# Patient Record
Sex: Male | Born: 1960 | Race: White | Hispanic: No | Marital: Single | Smoking: Former smoker
Health system: Southern US, Community
[De-identification: ages and names within clinical notes are randomized; demographics above are authoritative.]

## PROBLEM LIST (undated history)

## (undated) DIAGNOSIS — E039 Hypothyroidism, unspecified: Secondary | ICD-10-CM

## (undated) DIAGNOSIS — A630 Anogenital (venereal) warts: Secondary | ICD-10-CM

---

## 1999-01-29 ENCOUNTER — Encounter: Payer: Self-pay | Admitting: Specialist

## 1999-01-29 ENCOUNTER — Ambulatory Visit (HOSPITAL_COMMUNITY): Admission: RE | Admit: 1999-01-29 | Discharge: 1999-01-29 | Payer: Self-pay | Admitting: Specialist

## 1999-02-12 ENCOUNTER — Ambulatory Visit (HOSPITAL_COMMUNITY): Admission: RE | Admit: 1999-02-12 | Discharge: 1999-02-12 | Payer: Self-pay | Admitting: Specialist

## 1999-02-12 ENCOUNTER — Encounter: Payer: Self-pay | Admitting: Specialist

## 1999-02-26 ENCOUNTER — Ambulatory Visit (HOSPITAL_COMMUNITY): Admission: RE | Admit: 1999-02-26 | Discharge: 1999-02-26 | Payer: Self-pay | Admitting: Specialist

## 1999-02-28 ENCOUNTER — Encounter: Payer: Self-pay | Admitting: Specialist

## 2010-10-20 ENCOUNTER — Encounter: Payer: Self-pay | Admitting: Gastroenterology

## 2010-10-25 ENCOUNTER — Encounter: Payer: Self-pay | Admitting: Gastroenterology

## 2010-12-02 ENCOUNTER — Ambulatory Visit: Payer: Self-pay | Admitting: Gastroenterology

## 2010-12-02 DIAGNOSIS — E039 Hypothyroidism, unspecified: Secondary | ICD-10-CM | POA: Insufficient documentation

## 2010-12-02 DIAGNOSIS — R945 Abnormal results of liver function studies: Secondary | ICD-10-CM | POA: Insufficient documentation

## 2010-12-02 LAB — CONVERTED CEMR LAB
Free T4: 0.84 ng/dL (ref 0.60–1.60)
TSH: 1.9 microintl units/mL (ref 0.35–5.50)

## 2010-12-03 ENCOUNTER — Ambulatory Visit (HOSPITAL_COMMUNITY)
Admission: RE | Admit: 2010-12-03 | Discharge: 2010-12-03 | Payer: Self-pay | Source: Home / Self Care | Attending: Gastroenterology | Admitting: Gastroenterology

## 2010-12-31 ENCOUNTER — Ambulatory Visit
Admission: RE | Admit: 2010-12-31 | Discharge: 2010-12-31 | Payer: Self-pay | Source: Home / Self Care | Attending: Endocrinology | Admitting: Endocrinology

## 2010-12-31 DIAGNOSIS — R635 Abnormal weight gain: Secondary | ICD-10-CM | POA: Insufficient documentation

## 2010-12-31 DIAGNOSIS — E785 Hyperlipidemia, unspecified: Secondary | ICD-10-CM | POA: Insufficient documentation

## 2010-12-31 DIAGNOSIS — R6882 Decreased libido: Secondary | ICD-10-CM | POA: Insufficient documentation

## 2010-12-31 DIAGNOSIS — R5381 Other malaise: Secondary | ICD-10-CM | POA: Insufficient documentation

## 2010-12-31 DIAGNOSIS — R5383 Other fatigue: Secondary | ICD-10-CM

## 2011-01-03 ENCOUNTER — Other Ambulatory Visit: Payer: Self-pay | Admitting: Endocrinology

## 2011-01-03 ENCOUNTER — Ambulatory Visit
Admission: RE | Admit: 2011-01-03 | Discharge: 2011-01-03 | Payer: Self-pay | Source: Home / Self Care | Attending: Endocrinology | Admitting: Endocrinology

## 2011-01-03 LAB — TSH: TSH: 0.5 u[IU]/mL (ref 0.35–5.50)

## 2011-01-03 LAB — CORTISOL: Cortisol, Plasma: 1 ug/dL

## 2011-01-03 LAB — TESTOSTERONE: Testosterone: 369.39 ng/dL (ref 350.00–890.00)

## 2011-01-12 ENCOUNTER — Ambulatory Visit: Admit: 2011-01-12 | Payer: Self-pay | Admitting: Endocrinology

## 2011-01-25 NOTE — Assessment & Plan Note (Signed)
Summary: ELEVATED LIVER FUNCTIONS/YF   History of Present Illness Visit Type: Initial Consult Primary GI MD: Melvia Heaps MD [facility] Primary [place]ullen [place]: Cheri Rous, MD Requesting Kivon Aprea: Cheri Rous, MD Chief Complaint: Elevated liver funtion tests with overall fatigue for the last year. Pt states recently he has had some anxiety and fustration with fatigue sx. Pt denies any other sx.  History of Present Illness:   Dennis Valencia is a pleasant 50 year old white male referred at the request of Dr. Egbert Garibaldi for evaluation of abnormal liver tests.  For the past year he has been complaining of loss of libidol, fatigue, 45 pound weight gain and lack of energy.  He has been on thyroid medication at various doses for hypothyroidism.  He has had a battery of tests including CBC, comprehensive metabolic profile;   they were remarkable only for a positive ANA and ALT of 64.  Hepatic serologies for hepatitis were negative.  There is no history liver disease, alcoholism or drug abuse.   GI Review of Systems    Reports acid reflux and  weight gain.      Denies abdominal pain, belching, bloating, chest pain, dysphagia with liquids, dysphagia with solids, heartburn, loss of appetite, nausea, vomiting, vomiting blood, and  weight loss.      Reports constipation.     Denies anal fissure, black tarry stools, change in bowel habit, diarrhea, diverticulosis, fecal incontinence, heme positive stool, hemorrhoids, irritable bowel syndrome, jaundice, light color stool, liver problems, rectal bleeding, and  rectal pain. Preventive Screening-Counseling & Management  Alcohol-Tobacco     Smoking Status: quit      Drug Use:  no.      Current Medications (verified): 1)  Omeprazole 20 Mg Cpdr (Omeprazole) .... One Tablet By Mouth Once Daily 2)  Levothyroxine Sodium 150 Mcg Tabs (Levothyroxine Sodium) .... One Tablet By Mouth Once Daily  Allergies (verified): No Known Drug Allergies  Past History:  Past Medical  History: Hypothyroidism Anxiety Disorder  Past Surgical History: Unremarkable  Family History: Family History of Breast Cancer:Maternal Grandmother Family History of Diabetes: Cousin  Social History: Single Patient is a former smoker.  Alcohol Use - yes Daily Caffeine Use Illicit Drug Use - no Smoking Status:  quit Drug Use:  no  Review of Systems       The patient complains of fatigue and muscle pains/cramps.  The patient denies allergy/sinus, anemia, anxiety-new, arthritis/joint pain, back pain, blood in urine, breast changes/lumps, change in vision, confusion, cough, coughing up blood, depression-new, fainting, fever, headaches-new, hearing problems, heart murmur, heart rhythm changes, itching, menstrual pain, night sweats, nosebleeds, pregnancy symptoms, shortness of breath, skin rash, sleeping problems, sore throat, swelling of feet/legs, swollen lymph glands, thirst - excessive , urination - excessive , urination changes/pain, urine leakage, vision changes, and voice change.         All other systems were reviewed and were negative   Vital Signs:  Patient profile:   50 year old male Height:      77 inches Weight:      295 pounds BMI:     35.11 Pulse rate:   88 / minute Pulse rhythm:   regular BP sitting:   138 / 72  (right arm) Cuff size:   regular  Vitals Entered By: Christie Nottingham CMA Duncan Dull) (December 02, 2010 9:43 AM)  Physical Exam  Additional Exam:  On physical exam he is a well-developed well nourished male  skin: anicteric HEENT: normocephalic; PEERLA; no nasal or pharyngeal abnormalities neck:  supple nodes: no cervical lymphadenopathy chest: clear to ausculatation and percussion heart: no murmurs, gallops, or rubs abd: soft, nontender; BS normoactive; no abdominal masses, tenderness, organomegaly rectal: deferred ext: no cynanosis, clubbing, edema skeletal: no deformities neuro: oriented x 3; no focal abnormalities    Impression &  Recommendations:  Problem # 1:  NONSPECIFIC ABNORMAL RESULTS LIVR FUNCTION STUDY (ICD-794.8) Liver function abnormalities are probably due to hepatic steatosis.  Prior liver disease is unlikely.  Recommendations #1 abdominal ultrasound  Problem # 2:  HYPOTHYROIDISM (ICD-244.9)  Clinically his symptoms are very suggestive of hypothyroidism.  This includes his muscle pains, weight gain, loss of energy and myalgias.  Thyroid function tests are not available.  Medications #1 referral to endocrinology (Dr. Everardo All)  Orders: TLB-TSH (Thyroid Stimulating Hormone) (84443-TSH) TLB-T4 (Thyrox), Free [id number])  Other Orders: Ultrasound Abdomen (UAS)  Patient Instructions: 1)  Copy sent to : Cheri Rous, MD 2)  Your Ultrasound is scheduled on 12/03/2010 at 8am 3)  Your appointment with Dr Everardo All is scheduled on 12/31/2010 at 8:30am, Please bring medications and co-pay to your office appointment and if you need to cancel please call 24 hours in advance to avoid a no show fee 4)  The medication list was reviewed and reconciled.  All changed / newly prescribed medications were explained.  A complete medication list was provided to the patient / caregiver.

## 2011-01-25 NOTE — Letter (Signed)
Summary: Results Letter  [facility]  [address] [place], [place] [address]   Phone: [phone]  Fax: [phone]        December 02, 2010 MRN: [id number]    [facility] [address] [place], [place]  [address]    Dear Mr. ECHAVARRIA,  It is my pleasure to have treated you recently as a new patient in my office. I appreciate your confidence and the opportunity to participate in your care.  Since I do have a busy inpatient endoscopy schedule and office schedule, my office hours vary weekly. I am, however, available for emergency calls everyday through my office. If I am not available for an urgent office appointment, another one of our gastroenterologist will be able to assist you.  My well-trained staff are prepared to help you at all times. For emergencies after office hours, a physician from our Gastroenterology section is always available through my 24 hour answering service  Once again I welcome you as a new patient and I look forward to a happy and healthy relationship             Sincerely,  Louis Meckel MD  This letter has been electronically signed by your physician.  Appended Document: Results Letter letter mailed

## 2011-01-25 NOTE — Letter (Signed)
Summary: New Patient letter  [facility]  [address] [place], [place] [address]   Phone: [phone]  Fax: [phone]       10/25/2010 MRN: [id number]  [facility] [address] [place], [place]  [address]  Dear Mr. MILLIKAN,  Welcome to the Gastroenterology Division at [facility].    You are scheduled to see Dr.  Arlyce Dice on 12-02-10 at 10am on the 3rd floor at [facility], [address]  We ask that you try to arrive at our office 15 minutes prior to your appointment time to allow for check-in.  We would like you to complete the enclosed self-administered evaluation form prior to your visit and bring it with you on the day of your appointment.  We will review it with you.  Also, please bring a complete list of all your medications or, if you prefer, bring the medication bottles and we will list them.  Please bring your insurance card so that we Loria make a copy of it.  If your insurance requires a referral to see a specialist, please bring your referral form from your primary care physician.  Co-payments are due at the time of your visit and [place] be paid by cash, check or credit card.     Your office visit will consist of a consult with your physician (includes a physical exam), any laboratory testing he/she [place] order, scheduling of any necessary diagnostic testing (e.g. x-ray, ultrasound, CT-scan), and scheduling of a procedure (e.g. Endoscopy, Colonoscopy) if required.  Please allow enough time on your schedule to allow for any/all of these possibilities.    If you cannot keep your appointment, please call [phone] to cancel or reschedule prior to your appointment date.  This allows [place] the opportunity to schedule an appointment for another patient in need of care.  If you do not cancel or reschedule by 5 p.m. the business day prior to your appointment date, you will be charged a $50.00 late cancellation/no-show fee.    Thank you for choosing [facility] for your medical needs.  We appreciate the opportunity to care for you.  Please visit [place] at our website  to learn more about our practice.                     Sincerely,                                                             The Gastroenterology Division

## 2011-01-27 NOTE — Assessment & Plan Note (Signed)
Summary: New Endo/hypothyroidsim/Kaplan/cd   Vital Signs:  Patient profile:   50 year old male Height:      77 inches (195.58 cm) Weight:      252.25 pounds (114.66 kg) BMI:     30.02 O2 Sat:      96 % on Room air Temp:     98.8 degrees F (37.11 degrees C) oral Pulse rate:   80 / minute Pulse rhythm:   regular BP sitting:   118 / 86  (left arm) Cuff size:   large  Vitals Entered By: Brenton Grills CMA Duncan Dull) (December 31, 2010 8:39 AM)  O2 Flow:  Room air CC: New Endo Consult/Hypothyroidism/Dr. Kaplan/aj Is Patient Diabetic? No Comments Pt is no longer taking Omeprazole   Referring Provider:  r kaplan Primary Provider:  Cheri Rous, MD  CC:  New Endo Consult/Hypothyroidism/Dr. Kaplan/aj.  History of Present Illness: pt states few years of moderate weight gain at the abdominal area (50 lbs), and aassoc fatigue.  he has seen several specialists over the past 6 months, in attempt to explain his sxs.  w/u has been neg, except for elev tsh.  he was started on synthroid in mid-2011, at a dosage which has been increased to 125 micrograms/day.  tsh has normalized, but there has been no improvement in his sxs.  he took prednisone 2 mos ago, for approx 1 week.    Current Medications (verified): 1)  Omeprazole 20 Mg Cpdr (Omeprazole) .... One Tablet By Mouth Once Daily 2)  Levothyroxine Sodium 125 Mcg Tabs (Levothyroxine Sodium) .Marland Kitchen.. 1 Tablet By Mouth Once Daily  Allergies (verified): No Known Drug Allergies  Past History:  Past Medical History: Anxiety Disorder DYSLIPIDEMIA (ICD-272.4) HYPOTHYROIDISM (ICD-244.9) NONSPECIFIC ABNORMAL RESULTS LIVR FUNCTION STUDY (ICD-794.8)  Family History: Reviewed history from 12/02/2010 and no changes required. Family History of Breast Cancer:Maternal Grandmother Family History of Diabetes: Cousin.  Social History: Reviewed history from 12/02/2010 and no changes required. Single. Patient is a former smoker.  Alcohol Use - yes Daily  Caffeine Use Illicit Drug Use - no works as Consulting civil engineer.  Review of Systems       denies depression, constipation, numbness, polyuria, blurry vision, easy bruising, rhinorrhea, and syncope.  he reports decreased libido, leg cramps, hair loss, emotional lability, dry skin, and difficulty with concentration.    Physical Exam  General:  normal appearance.   Head:  head: no deformity eyes: no periorbital swelling, no proptosis external nose and ears are normal mouth: no lesion seen Neck:  Supple without thyroid enlargement or tenderness. No cervical lymphadenopathy, neck masses or tracheal deviation.  Lungs:  Clear to auscultation bilaterally. Normal respiratory effort.  Heart:  Regular rate and rhythm without murmurs or gallops noted. Normal S1,S2.   Abdomen:  abdomen is soft, nontender.  no hepatosplenomegaly.   not distended.  no hernia  Genitalia:  Normal external male genitalia with no urethral discharge.  Msk:  muscle bulk and strength are grossly normal.  no obvious joint swelling.  gait is normal and steady  Pulses:  dorsalis pedis intact bilat.  no carotid bruit  Extremities:  no deformity.  no ulcer on the feet.  feet are of normal color and temp.  no edema  Neurologic:  cn 2-12 grossly intact.   readily moves all 4's.   sensation is intact to touch on the feet  Skin:  normal texture and temp.  no rash is seen.  not diaphoretic.  no abdominal or  flank striae.  Cervical Nodes:  No significant adenopathy.  Psych:  Alert and cooperative; normal mood and affect; normal attention span and concentration.   Additional Exam:  FastTSH                   1.90 uIU/mL                 0.35-5.50 Free T4                   0.84 ng/dL     Impression & Recommendations:  Problem # 1:  WEIGHT GAIN (ICD-783.1) uncertain etiology  Problem # 2:  HYPOTHYROIDISM (ICD-244.9) well-replaced  Problem # 3:  DECREASED LIBIDO (ICD-799.81) uncertain etiology  Medications  Added to Medication List This Visit: 1)  Levothyroxine Sodium 125 Mcg Tabs (Levothyroxine sodium) .Marland Kitchen.. 1 tablet by mouth once daily 2)  Dexamethasone 1 Mg Tabs (Dexamethasone) .Marland Kitchen.. 1 tab at 10 pm, the night before blood test  Other Orders: Consultation Level IV (84696)  Patient Instructions: 1)  you should do a "dexamethasone suppression test."  for this, you would take dexamethasone 1 mg at 10 pm, then come in for a "cortisol"  (783.1) blood test the next morning before 9 am.  you do not need to be fasting for this test. 2)  also recheck tsh 244.9 and testosterone 799.81 then.   Prescriptions: DEXAMETHASONE 1 MG TABS (DEXAMETHASONE) 1 tab at 10 pm, the night before blood test  #1 x 0   Entered and Authorized by:   Minus Breeding MD   Signed by:   Minus Breeding MD on 12/31/2010   Method used:   Electronically to        [id number] (retail)       [address]       [place], [place]  [address]       Ph: [phone] or [phone]       Fax: [phone]   RxID:   [id number]    Orders Added: 1)  Consultation Level IV [06301]

## 2012-08-23 ENCOUNTER — Other Ambulatory Visit: Payer: Self-pay | Admitting: Urology

## 2012-10-09 ENCOUNTER — Encounter (HOSPITAL_BASED_OUTPATIENT_CLINIC_OR_DEPARTMENT_OTHER): Payer: Self-pay | Admitting: *Deleted

## 2012-10-09 NOTE — Progress Notes (Signed)
NPO AFTER MN. ARRIVES AT 0830. NEEDS HG. WILL TAKE SYNTHROID AM OF SURG W/ SIP OF WATER.

## 2012-10-11 DIAGNOSIS — A630 Anogenital (venereal) warts: Secondary | ICD-10-CM | POA: Diagnosis present

## 2012-10-11 NOTE — H&P (Signed)
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   Interval history: He reports that over the past several months he has noted a change in a lesion on his penis. He was very concerned about cancer. He was seen and evaluated and his lesions were felt to be condylomatous lesions. An attempt at treating one of the lesions on the shaft of his penis was undertaken but it did not appear successful and he was referred for further evaluation and treatment. He reports the lesions have increased in size over time. He has not had any sprain or splitting of his urinary stream and no blood spotting in his underwear. He reports having lesions on the shaft of his penis as well as near the anus. The lesions would be considered of mild severity with no modifying factors or associated signs and symptoms.   Past Medical History Problems  1. History of  Hypothyroidism 244.9  Surgical History Problems  1. History of  No Surgical Problems  Current Meds 1. Betamethasone Dipropionate Aug 0.05 % External Cream; Apply a thin coating to the affected  area 2 times per day; Therapy: 04Sep2012 to (Last Rx:04Sep2012)  Requested for: 04Sep2012 2. Cialis 20 MG Oral Tablet; TAKE TABLET  PRN; Therapy: (Recorded:04Sep2012) to 3. Multi Vitamin/Minerals TABS; Therapy: (Recorded:04Sep2012) to 4. Synthroid 125 MCG Oral Tablet; Therapy: (Recorded:04Sep2012) to 5. Viagra 100 MG Oral Tablet; TAKE TABLET  PRN; Therapy: (Recorded:04Sep2012) to 6. Vitamin D TABS; Therapy: (Recorded:04Sep2012) to  Allergies Medication  1. No Known Drug Allergies  Family History Problems  1. Family history of  Death In The Family Father 2. Family history of  Death In The Family Mother  Social History Problems    Alcohol Use   Caffeine Use   Exercise Habits   Marital History - Single   Occupation:   Tobacco Use 305.1  Review of Systems Genitourinary, constitutional, skin, eye,  otolaryngeal, hematologic/lymphatic, cardiovascular, pulmonary, endocrine, musculoskeletal, gastrointestinal, neurological and psychiatric system(s) were reviewed and pertinent findings if present are noted.  Genitourinary: erectile dysfunction, penile lesion and scrotal lesion.    Vitals Vital Signs BMI Calculated: 28.35 BSA Calculated: 2.46 Height: 6 ft 6 in Weight: 245 lb  Blood Pressure: 112 / 73 Temperature: 99.2 F Heart Rate: 86  Physical Exam Constitutional: Well nourished and well developed . No acute distress.  ENT:. The ears and nose are normal in appearance.  Neck: The appearance of the neck is normal and no neck mass is present.  Pulmonary: No respiratory distress and normal respiratory rhythm and effort.  Cardiovascular: Heart rate and rhythm are normal . No peripheral edema.  Abdomen: The abdomen is soft and nontender. No masses are palpated. No CVA tenderness. No hernias are palpable. No hepatosplenomegaly noted.  Rectal: The perineum is normal on inspection.  Genitourinary: Examination of the penis demonstrates no discharge, no masses and a normal meatus. The penis is uncircumcised.  He has multiple condylomatous lesions involving the midshaft dorsally as well as laterally. He also has a lesion on the scrotum. The scrotum is with lesions. The right epididymis is palpably normal and non-tender. The left epididymis is palpably normal and non-tender. The right testis is non-tender and without masses. The left testis is non-tender and without masses.  I noted 2 or 3 condylomatous lesions near the anus as well. There were primarily located on the right side.  Lymphatics: The femoral and inguinal nodes are not enlarged or tender.  Skin: Normal  skin turgor, no visible rash and no visible skin lesions.  Neuro/Psych:. Mood and affect are appropriate.    Results/Data  Old records or history reviewed: Notes from Dr. Molinda Bailiff office as above.  The following medical tests were  reviewed: Urinalysis today is clear.    Assessment Assessed  1. 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 We discussed the treatment options. Their number and size would likely not respond to topical agents. We discussed cryoablation but again there number and size would be best managed with laser ablation. I told him none of them appeared to be worrisome for cancer. We therefore discussed CO2 laser fulguration/ablation of his condylomatous lesions both on the penis, scrotum and perianal region under anesthesia.   Plan    He'll be scheduled for CO2 of his condylomatous lesions as an outpatient under anesthesia.

## 2012-10-12 ENCOUNTER — Encounter (HOSPITAL_BASED_OUTPATIENT_CLINIC_OR_DEPARTMENT_OTHER)
Admission: RE | Disposition: A | Discharge status: Home / Self Care | Payer: Self-pay | Source: Ambulatory Visit | Attending: Urology

## 2012-10-12 ENCOUNTER — Ambulatory Visit (HOSPITAL_BASED_OUTPATIENT_CLINIC_OR_DEPARTMENT_OTHER): Payer: 59 | Admitting: Anesthesiology

## 2012-10-12 ENCOUNTER — Encounter (HOSPITAL_BASED_OUTPATIENT_CLINIC_OR_DEPARTMENT_OTHER): Payer: Self-pay | Admitting: Anesthesiology

## 2012-10-12 ENCOUNTER — Encounter (HOSPITAL_BASED_OUTPATIENT_CLINIC_OR_DEPARTMENT_OTHER): Payer: Self-pay | Admitting: *Deleted

## 2012-10-12 ENCOUNTER — Ambulatory Visit (HOSPITAL_BASED_OUTPATIENT_CLINIC_OR_DEPARTMENT_OTHER)
Admission: RE | Admit: 2012-10-12 | Discharge: 2012-10-12 | Disposition: A | Discharge status: Home / Self Care | Payer: 59 | Source: Ambulatory Visit | Attending: Urology | Admitting: Urology

## 2012-10-12 DIAGNOSIS — Z79899 Other long term (current) drug therapy: Secondary | ICD-10-CM | POA: Insufficient documentation

## 2012-10-12 DIAGNOSIS — E039 Hypothyroidism, unspecified: Secondary | ICD-10-CM | POA: Insufficient documentation

## 2012-10-12 DIAGNOSIS — A630 Anogenital (venereal) warts: Secondary | ICD-10-CM | POA: Insufficient documentation

## 2012-10-12 HISTORY — DX: Hypothyroidism, unspecified: E03.9

## 2012-10-12 HISTORY — PX: CONDYLOMA EXCISION/FULGURATION: SHX1389

## 2012-10-12 HISTORY — DX: Anogenital (venereal) warts: A63.0

## 2012-10-12 LAB — POCT HEMOGLOBIN-HEMACUE: Hemoglobin: 15.1 g/dL (ref 13.0–17.0)

## 2012-10-12 SURGERY — REMOVAL, CONDYLOMA
Anesthesia: General | Site: Penis

## 2012-10-12 MED ORDER — LIDOCAINE HCL (CARDIAC) 20 MG/ML IV SOLN
INTRAVENOUS | Status: DC | PRN
  Administered 2012-10-12: 80 mg via INTRAVENOUS

## 2012-10-12 MED ORDER — ACETAMINOPHEN 10 MG/ML IV SOLN: 1000.0000 mg | Freq: Once | INTRAVENOUS | Status: DC | PRN

## 2012-10-12 MED ORDER — BACITRACIN-NEOMYCIN-POLYMYXIN 400-5-5000 EX OINT
TOPICAL_OINTMENT | CUTANEOUS | Status: DC | PRN
  Administered 2012-10-12: 1 via TOPICAL

## 2012-10-12 MED ORDER — FENTANYL CITRATE 0.05 MG/ML IJ SOLN
INTRAMUSCULAR | Status: DC | PRN
  Administered 2012-10-12: 100 ug via INTRAVENOUS

## 2012-10-12 MED ORDER — ACETAMINOPHEN 10 MG/ML IV SOLN
INTRAVENOUS | Status: DC | PRN
  Administered 2012-10-12: 1000 mg via INTRAVENOUS

## 2012-10-12 MED ORDER — PROMETHAZINE HCL 25 MG/ML IJ SOLN: 6.2500 mg | INTRAMUSCULAR | Status: DC | PRN

## 2012-10-12 MED ORDER — PROPOFOL 10 MG/ML IV BOLUS
INTRAVENOUS | Status: DC | PRN
  Administered 2012-10-12: 300 mg via INTRAVENOUS

## 2012-10-12 MED ORDER — MEPERIDINE HCL 25 MG/ML IJ SOLN: 6.2500 mg | INTRAMUSCULAR | Status: DC | PRN

## 2012-10-12 MED ORDER — HYDROCODONE-ACETAMINOPHEN 7.5-325 MG PO TABS: 1.0000 | ORAL_TABLET | ORAL | Status: DC | PRN

## 2012-10-12 MED ORDER — OXYCODONE HCL 5 MG PO TABS: 5.0000 mg | ORAL_TABLET | Freq: Once | ORAL | Status: DC | PRN

## 2012-10-12 MED ORDER — CEFAZOLIN SODIUM-DEXTROSE 2-3 GM-% IV SOLR
2.0000 g | INTRAVENOUS | Status: AC
  Administered 2012-10-12: 2 g via INTRAVENOUS

## 2012-10-12 MED ORDER — ONDANSETRON HCL 4 MG/2ML IJ SOLN
INTRAMUSCULAR | Status: DC | PRN
  Administered 2012-10-12: 4 mg via INTRAVENOUS

## 2012-10-12 MED ORDER — KETOROLAC TROMETHAMINE 30 MG/ML IJ SOLN
INTRAMUSCULAR | Status: DC | PRN
  Administered 2012-10-12: 30 mg via INTRAVENOUS

## 2012-10-12 MED ORDER — OXYCODONE HCL 5 MG/5ML PO SOLN: 5.0000 mg | Freq: Once | ORAL | Status: DC | PRN

## 2012-10-12 MED ORDER — DEXAMETHASONE SODIUM PHOSPHATE 4 MG/ML IJ SOLN
INTRAMUSCULAR | Status: DC | PRN
  Administered 2012-10-12: 10 mg via INTRAVENOUS

## 2012-10-12 MED ORDER — MIDAZOLAM HCL 5 MG/5ML IJ SOLN
INTRAMUSCULAR | Status: DC | PRN
  Administered 2012-10-12: 2 mg via INTRAVENOUS

## 2012-10-12 MED ORDER — LACTATED RINGERS IV SOLN
INTRAVENOUS | Status: DC
  Administered 2012-10-12: 100 mL/h via INTRAVENOUS

## 2012-10-12 MED ORDER — HYDROMORPHONE HCL PF 1 MG/ML IJ SOLN: 0.2500 mg | INTRAMUSCULAR | Status: DC | PRN

## 2012-10-12 SURGICAL SUPPLY — 16 items
CANISTER SUCTION 1200CC (MISCELLANEOUS) IMPLANT
CANISTER SUCTION 2500CC (MISCELLANEOUS) IMPLANT
CLOTH BEACON ORANGE TIMEOUT ST (SAFETY) ×2 IMPLANT
DEPRESSOR TONGUE BLADE STERILE (MISCELLANEOUS) ×2 IMPLANT
GLOVE BIO SURGEON STRL SZ7 (GLOVE) ×1 IMPLANT
GLOVE BIO SURGEON STRL SZ8 (GLOVE) ×2 IMPLANT
GLOVE BIOGEL PI IND STRL 7.0 (GLOVE) IMPLANT
GLOVE BIOGEL PI INDICATOR 7.0 (GLOVE) ×1
GOWN PREVENTION PLUS XLARGE (GOWN DISPOSABLE) ×1 IMPLANT
GOWN STRL NON-REIN LRG LVL3 (GOWN DISPOSABLE) ×1 IMPLANT
IV NS IRRIG 3000ML ARTHROMATIC (IV SOLUTION) IMPLANT
PACK BASIN DAY SURGERY FS (CUSTOM PROCEDURE TRAY) ×2 IMPLANT
TOWEL OR 17X24 6PK STRL BLUE (TOWEL DISPOSABLE) ×3 IMPLANT
VACUUM HOSE 7/8X10 W/ WAND (MISCELLANEOUS) ×2 IMPLANT
WATER STERILE IRR 3000ML UROMA (IV SOLUTION) IMPLANT
WATER STERILE IRR 500ML POUR (IV SOLUTION) ×1 IMPLANT

## 2012-10-12 NOTE — Anesthesia Postprocedure Evaluation (Signed)
Anesthesia Post Note  Patient: Dennis Valencia  Procedure(s) Performed: Procedure(s) (LRB): CONDYLOMA REMOVAL (N/A) CO2 LASER APPLICATION (N/A)  Anesthesia type: General  Patient location: PACU  Post pain: Pain level controlled  Post assessment: Post-op Vital signs reviewed  Last Vitals: BP 113/84  Pulse 71  Temp 36.1 C (Oral)  Resp 14  Ht 6' (1.829 m)  Wt 251 lb (113.853 kg)  BMI 34.04 kg/m2  SpO2 95%  Post vital signs: Reviewed  Level of consciousness: sedated  Complications: No apparent anesthesia complications

## 2012-10-12 NOTE — Op Note (Signed)
PATIENT:  Dennis Valencia  PRE-OPERATIVE DIAGNOSIS:  Suprapubic, penile and perianal condyloma  POST-OPERATIVE DIAGNOSIS:  Same  PROCEDURE:  Procedure(s): CO2 laser ablation of condyloma 1. 6 - 0.5 cm lesions on the suprapubic region and penile shaft.  2. 11 - 0.25 cm lesions treated on the suprapubic region and penile shaft. 3. 2 - 0.25 cm lesions treated in the perianal region. 4. 1 - 1.0 cm lesion treated in the perianal region. Total number of lesions treated: 20  SURGEON:  Garnett Farm  INDICATION: Mr. Dennis Valencia is a 51 year old male patient with a history of genital condyloma. He presented with recurrent lesions. He is brought to the operating room today for CO2 laser ablation of these multiple lesions.  ANESTHESIA:  General  EBL:   none  Description of procedure: After informed consent the patient was taken to the major or, placed on the table and administered general anesthesia. He was then moved to the dorsal lithotomy position and his genitalia and perianal region as well as suprapubic region were sterilely prepped with Hibiclens and draped. An official timeout was then performed.  The CO2 laser was then used at a setting of 3 W and all above noted lesions were treated. There were no lesions remaining after the completion of his treatment. I then applied Neosporin ointment to each of the treatment sites and the patient was awakened and taken recovery room in stable and satisfactory condition. He tolerated the procedure well with no intraoperative complications.  PLAN OF CARE: Discharge to home after PACU  PATIENT DISPOSITION:  PACU - hemodynamically stable.

## 2012-10-12 NOTE — Transfer of Care (Signed)
Immediate Anesthesia Transfer of Care Note  Patient: Dennis Valencia  Procedure(s) Performed: Procedure(s) (LRB) with comments: CONDYLOMA REMOVAL (N/A) - CO2 LASER OF PENILE AD ANAL WARTS CO2 LASER APPLICATION (N/A) - penile, anal warts  Patient Location: PACU  Anesthesia Type: General  Level of Consciousness: sedated and responds to stimulation  Airway & Oxygen Therapy: Patient Spontanous Breathing and Patient connected to nasal cannula oxygen  Post-op Assessment: Report given to PACU RN  Post vital signs: Reviewed and stable  Complications: No apparent anesthesia complications

## 2012-10-12 NOTE — Anesthesia Procedure Notes (Signed)
Procedure Name: LMA Insertion Date/Time: 10/12/2012 9:41 AM Performed by: Maris Berger T Pre-anesthesia Checklist: Patient identified, Emergency Drugs available, Suction available and Patient being monitored Patient Re-evaluated:Patient Re-evaluated prior to inductionOxygen Delivery Method: Circle System Utilized Preoxygenation: Pre-oxygenation with 100% oxygen Intubation Type: IV induction Ventilation: Mask ventilation without difficulty LMA: LMA inserted LMA Size: 5.0 Number of attempts: 1 Placement Confirmation: positive ETCO2 Dental Injury: Teeth and Oropharynx as per pre-operative assessment  Comments: GAuze roll between teeth

## 2012-10-12 NOTE — Interval H&P Note (Signed)
History and Physical Interval Note:  10/12/2012 10:04 AM  Dennis Valencia  has presented today for surgery, with the diagnosis of CONDYLOMA PENILE AND ANAL  The various methods of treatment have been discussed with the patient and family. After consideration of risks, benefits and other options for treatment, the patient has consented to  Procedure(s) (LRB) with comments: CONDYLOMA REMOVAL (N/A) - CO2 LASER OF PENILE AD ANAL WARTS CO2 LASER APPLICATION (N/A) - penile, anal warts as a surgical intervention .  The patient's history has been reviewed, patient examined, no change in status, stable for surgery.  I have reviewed the patient's chart and labs.  Questions were answered to the patient's satisfaction.     Garnett Farm

## 2012-10-12 NOTE — Progress Notes (Signed)
Glasses returned 

## 2012-10-12 NOTE — Anesthesia Preprocedure Evaluation (Addendum)
Anesthesia Evaluation  Patient identified by MRN, date of birth, ID band Patient awake    Reviewed: Allergy & Precautions, H&P , NPO status , Patient's Chart, lab work & pertinent test results  Airway Mallampati: I TM Distance: >3 FB Neck ROM: Full    Dental  (+) Dental Advisory Given and Teeth Intact   Pulmonary neg pulmonary ROS,  breath sounds clear to auscultation  Pulmonary exam normal       Cardiovascular - CAD Rhythm:Regular Rate:Normal     Neuro/Psych PSYCHIATRIC DISORDERS negative neurological ROS     GI/Hepatic negative GI ROS, Neg liver ROS,   Endo/Other  Hypothyroidism   Renal/GU negative Renal ROS     Musculoskeletal negative musculoskeletal ROS (+)   Abdominal   Peds  Hematology negative hematology ROS (+)   Anesthesia Other Findings   Reproductive/Obstetrics                          Anesthesia Physical Anesthesia Plan  ASA: II  Anesthesia Plan: General   Post-op Pain Management:    Induction: Intravenous  Airway Management Planned: LMA  Additional Equipment:   Intra-op Plan:   Post-operative Plan: Extubation in OR  Informed Consent: I have reviewed the patients History and Physical, chart, labs and discussed the procedure including the risks, benefits and alternatives for the proposed anesthesia with the patient or authorized representative who has indicated his/her understanding and acceptance.   Dental advisory given  Plan Discussed with: CRNA  Anesthesia Plan Comments:         Anesthesia Quick Evaluation

## 2012-10-15 ENCOUNTER — Encounter (HOSPITAL_BASED_OUTPATIENT_CLINIC_OR_DEPARTMENT_OTHER): Payer: Self-pay | Admitting: Urology

## 2012-12-04 ENCOUNTER — Emergency Department (HOSPITAL_COMMUNITY)
Admission: EM | Admit: 2012-12-04 | Discharge: 2012-12-04 | Disposition: A | Discharge status: Home / Self Care | Payer: 59 | Attending: Emergency Medicine | Admitting: Emergency Medicine

## 2012-12-04 ENCOUNTER — Emergency Department (HOSPITAL_COMMUNITY): Payer: 59

## 2012-12-04 ENCOUNTER — Encounter (HOSPITAL_COMMUNITY): Payer: Self-pay | Admitting: *Deleted

## 2012-12-04 DIAGNOSIS — Z87891 Personal history of nicotine dependence: Secondary | ICD-10-CM | POA: Insufficient documentation

## 2012-12-04 DIAGNOSIS — J3489 Other specified disorders of nose and nasal sinuses: Secondary | ICD-10-CM | POA: Insufficient documentation

## 2012-12-04 DIAGNOSIS — Y929 Unspecified place or not applicable: Secondary | ICD-10-CM | POA: Insufficient documentation

## 2012-12-04 DIAGNOSIS — Z79899 Other long term (current) drug therapy: Secondary | ICD-10-CM | POA: Insufficient documentation

## 2012-12-04 DIAGNOSIS — H539 Unspecified visual disturbance: Secondary | ICD-10-CM

## 2012-12-04 DIAGNOSIS — Y939 Activity, unspecified: Secondary | ICD-10-CM | POA: Insufficient documentation

## 2012-12-04 DIAGNOSIS — Z8619 Personal history of other infectious and parasitic diseases: Secondary | ICD-10-CM | POA: Insufficient documentation

## 2012-12-04 DIAGNOSIS — S0990XA Unspecified injury of head, initial encounter: Secondary | ICD-10-CM | POA: Insufficient documentation

## 2012-12-04 DIAGNOSIS — E039 Hypothyroidism, unspecified: Secondary | ICD-10-CM | POA: Insufficient documentation

## 2012-12-04 DIAGNOSIS — IMO0002 Reserved for concepts with insufficient information to code with codable children: Secondary | ICD-10-CM | POA: Insufficient documentation

## 2012-12-04 DIAGNOSIS — H538 Other visual disturbances: Secondary | ICD-10-CM | POA: Insufficient documentation

## 2012-12-04 NOTE — ED Notes (Signed)
Patient transported from CT 

## 2012-12-04 NOTE — ED Provider Notes (Signed)
History  This chart was scribed for Dennis Racer, MD by Manuela Schwartz, ED scribe. This patient was seen in room TR04C/TR04C and the patient's care was started at 1414.   CSN: [id number]  Arrival date & time 12/04/12  1414   First MD Initiated Contact with Patient 12/04/12 1442      Chief Complaint  Patient presents with  . Eye Problem   The history is provided by the patient. No language interpreter was used.   Dennis Valencia is a 51 y.o. male who presents to the Emergency Department complaining of constant left eye with blurry vision when he woke up this AM. He states the blurry vision only in left eye and mostly blurry in ambient field of his vision, and clearer in peripheral vision. He hit his head on a cabinet yesterday on the top of is head but states its not bothering him now and had no LOC. He takes no blood thinners. He states some mild nasal congestion. No hx of DM.  He denies previous similar episodes.  Past Medical History  Diagnosis Date  . Condyloma acuminatum of penis   . Anal condyloma   . Hypothyroidism     Past Surgical History  Procedure Date  . Condyloma excision/fulguration 10/12/2012    Procedure: CONDYLOMA REMOVAL;  Surgeon: Garnett Farm, MD;  Location: [facility];  Service: Urology;  Laterality: N/A;  CO2 LASER OF PENILE AD ANAL WARTS    History reviewed. No pertinent family history.  History  Substance Use Topics  . Smoking status: Former Smoker -- 0.5 packs/day for 8 years    Types: Cigarettes    Quit date: 10/09/2010  . Smokeless tobacco: Never Used  . Alcohol Use: No      Review of Systems  Constitutional: Negative for fever and chills.  Eyes: Positive for visual disturbance (blurry vision left eye).  Respiratory: Negative for shortness of breath.   Gastrointestinal: Negative for nausea and vomiting.  Neurological: Negative for weakness.  All other systems reviewed and are negative.    Allergies  Review of patient's  allergies indicates no known allergies.  Home Medications   Current Outpatient Rx  Name  Route  Sig  Dispense  Refill  . IBUPROFEN 200 MG PO TABS   Oral   Take 200 mg by mouth every 6 (six) hours as needed. For pain         . LEVOTHYROXINE SODIUM 125 MCG PO CAPS   Oral   Take 125 mcg by mouth every morning.         . ADULT MULTIVITAMIN W/MINERALS CH   Oral   Take 1 tablet by mouth daily.           Triage Vitals: BP 158/89  Pulse 88  Temp 99.1 F (37.3 C)  Resp 18  SpO2 94%  Physical Exam  Nursing note and vitals reviewed. Constitutional: He is oriented to person, place, and time. He appears well-developed and well-nourished. No distress.  HENT:  Head: Normocephalic and atraumatic.  Eyes: Conjunctivae normal and EOM are normal. Pupils are equal, round, and reactive to light. Right eye exhibits no discharge. Left eye exhibits no discharge.       No scleral injection, no hyphema of left eye. Pupils are 3 mm dilated and reactive. Retina with increased vascularity No d/c. Conjunctiva are normal without discharge. Full ROM of EOMs, visual fields are intact.   Neck: Neck supple. No tracheal deviation present.  Cardiovascular: Normal rate.  Pulmonary/Chest: Effort normal. No respiratory distress.  Musculoskeletal: Normal range of motion.  Neurological: He is alert and oriented to person, place, and time.  Skin: Skin is warm and dry.  Psychiatric: He has a normal mood and affect. His behavior is normal.    ED Course  Procedures (including critical care time) DIAGNOSTIC STUDIES: Oxygen Saturation is 94% on room air, adequate by my interpretation.    COORDINATION OF CARE: At 315 PM Discussed treatment plan with patient which includes visual acuity, head CT. Patient agrees.   At 330 PM Spoke with Dr. Luciana Axe the opthamologist over the phone concerning for pt's eye problem who will see pt in his office 8 am tomorrow  Labs Reviewed - No data to display Ct Head Wo  Contrast  12/04/2012  *RADIOLOGY REPORT*  Clinical Data: Left eye visual changes, head trauma  CT HEAD WITHOUT CONTRAST  Technique:  Contiguous axial images were obtained from the base of the skull through the vertex without contrast.  Comparison: None.  Findings: No evidence of parenchymal hemorrhage or extra-axial fluid collection. No mass lesion, mass effect, or midline shift.  No CT evidence of acute infarction.  Cerebral volume is age appropriate.  No ventriculomegaly.  The visualized paranasal sinuses are essentially clear. The mastoid air cells are unopacified.  No evidence of calvarial fracture.  IMPRESSION: No evidence of acute intracranial abnormality.   Original Report Authenticated By: Charline Bills, M.D.      1. Visual changes       MDM  I personally performed the services described in this documentation, which was scribed in my presence. The recorded information has been reviewed and is accurate.           Dennis Racer, MD 12/04/12 1950

## 2012-12-04 NOTE — ED Notes (Addendum)
Pt reports that he woke up this am and was unable to see clearly out of his (L) eye.  States that his vision is blurry and 'cloudy'.  Denies injury or pain.  Pt unable to see peripherally out of his (L) eye.  PERRLA.

## 2012-12-04 NOTE — ED Notes (Signed)
Patient is alert and orientedx4.  Patient was explained discharge instructions and he did not have any questions.

## 2014-10-15 IMAGING — CT CT HEAD W/O CM
1 series · 16 of 30 positions shown, 20 images · non-contrast
Comparison: None.

CLINICAL DATA: Left eye visual changes, head trauma

CT HEAD WITHOUT CONTRAST
TECHNIQUE: Contiguous axial images were obtained from the base of
the skull through the vertex without contrast.

[Series 2: head routine 4.8 h37s · axial · 0.43mm/px · z∈[-94,+63]mm · 16 of 36 slices shown, 20 images]
[im 2/36  brain]
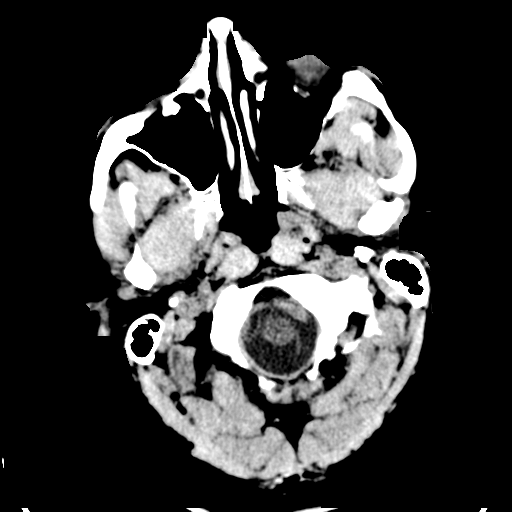
[im 2/36  bone]
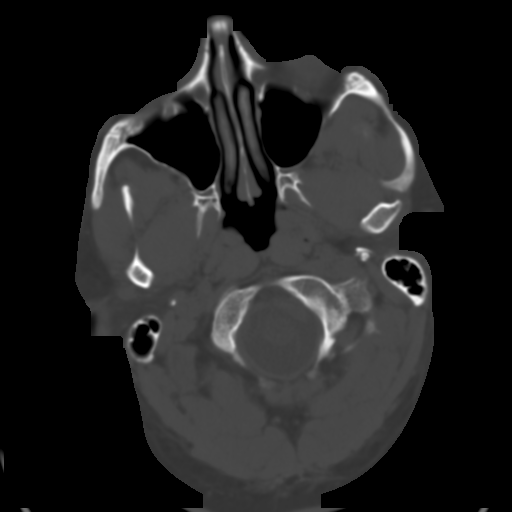
[im 4/36  brain]
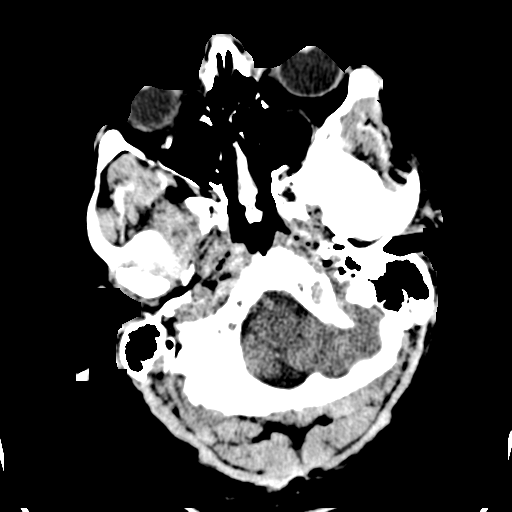
[im 7/36  brain]
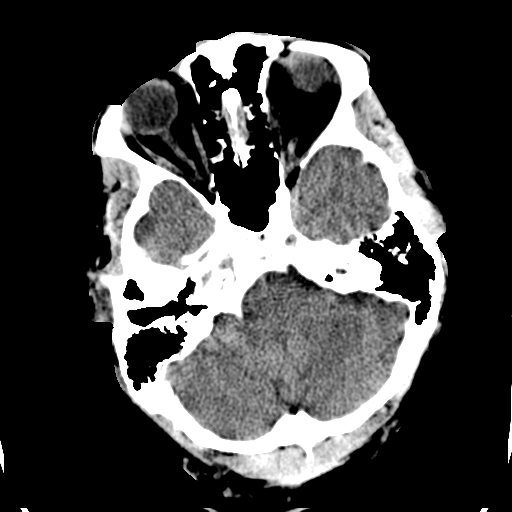
[im 9/36  brain]
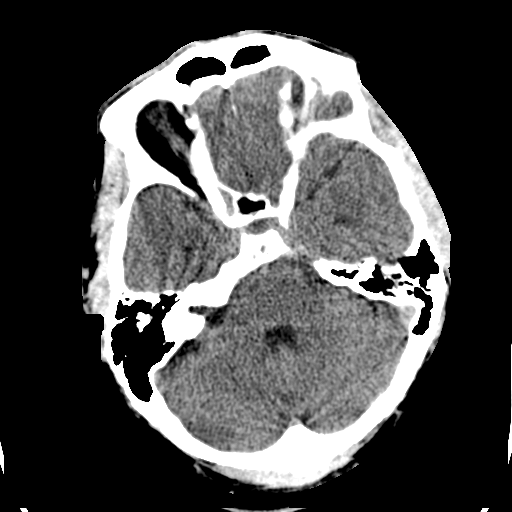
[im 10/36  brain]
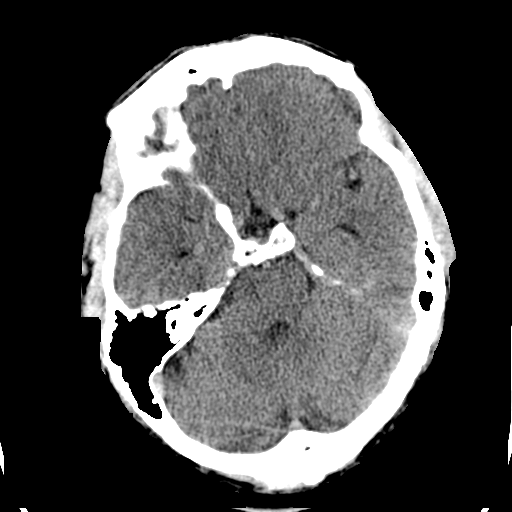
[im 10/36  bone]
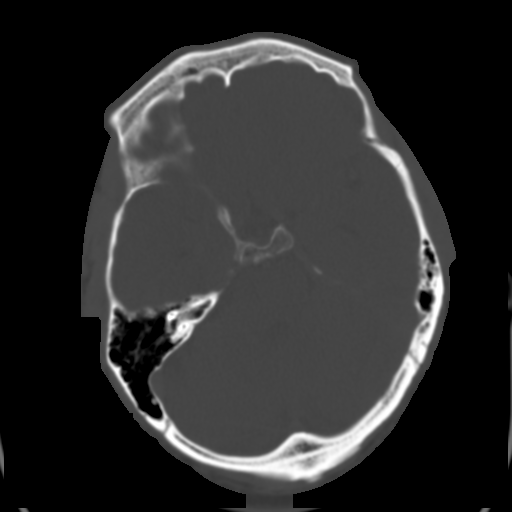
[im 13/36  brain]
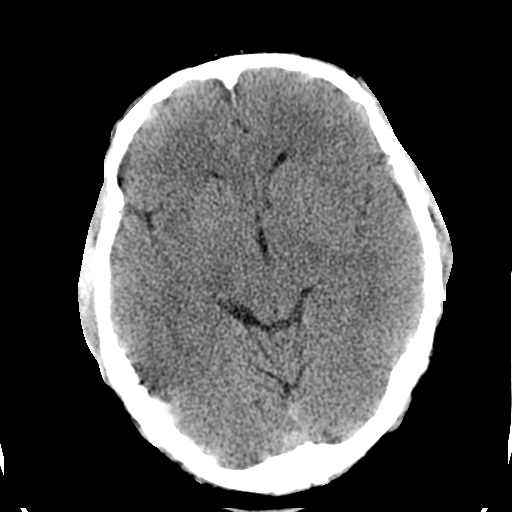
[im 15/36  brain]
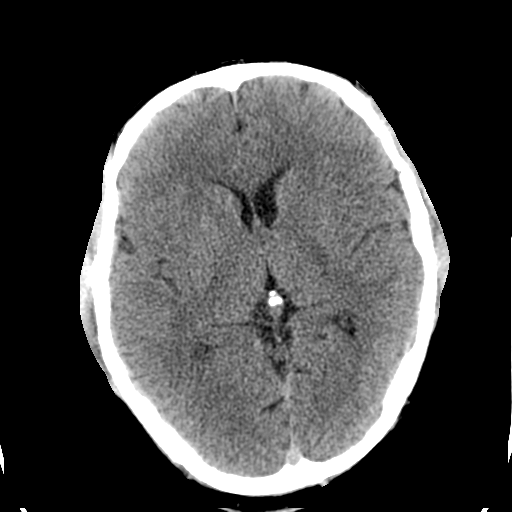
[im 17/36  brain]
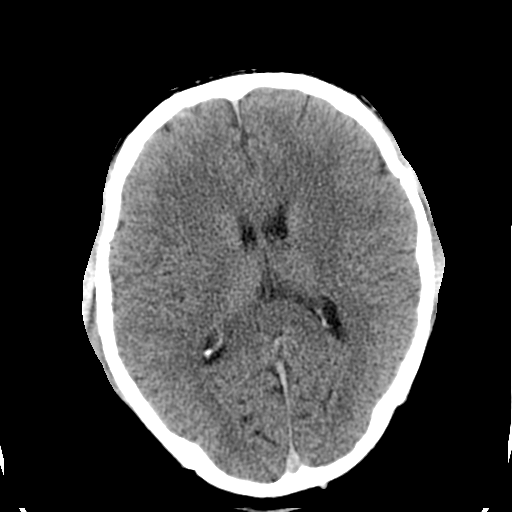
[im 19/36  brain]
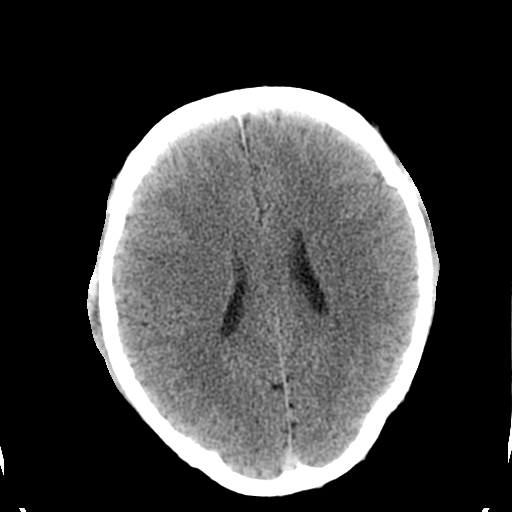
[im 19/36  bone]
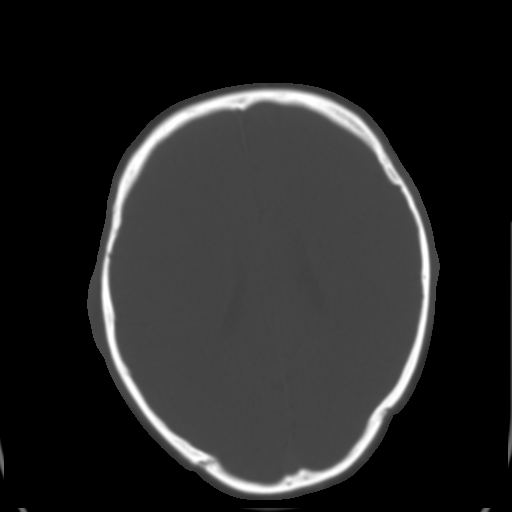
[im 21/36  brain]
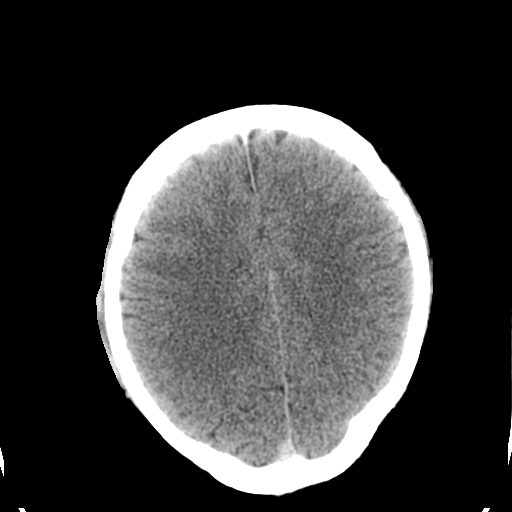
[im 23/36  brain]
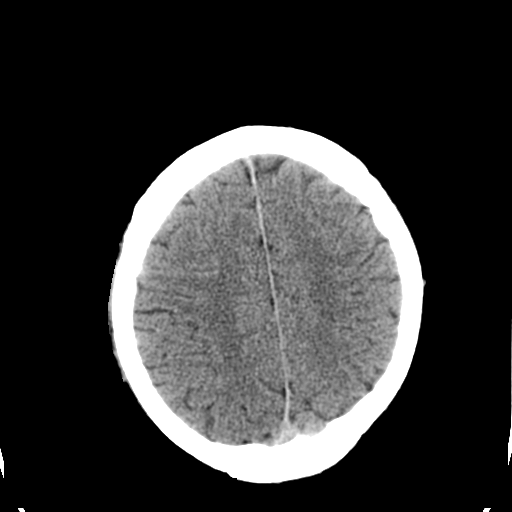
[im 26/36  brain]
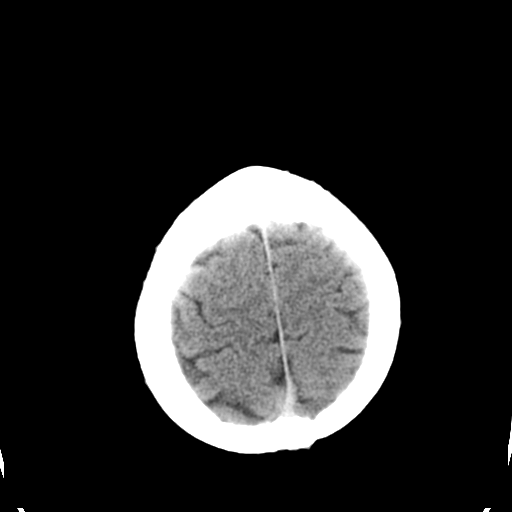
[im 27/36  brain]
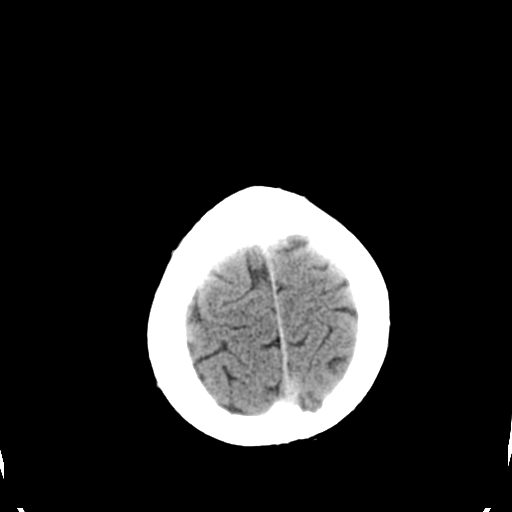
[im 27/36  bone]
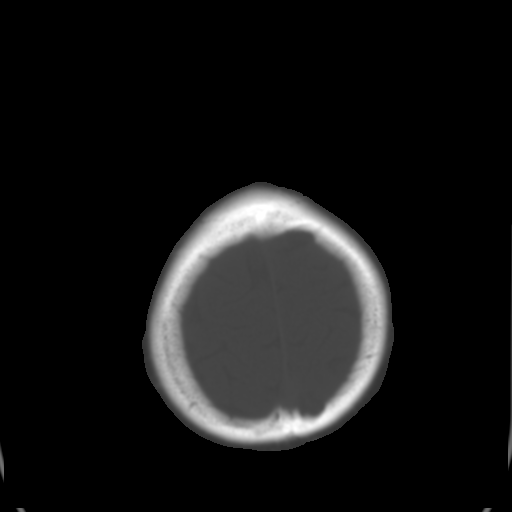
[im 29/36  brain]
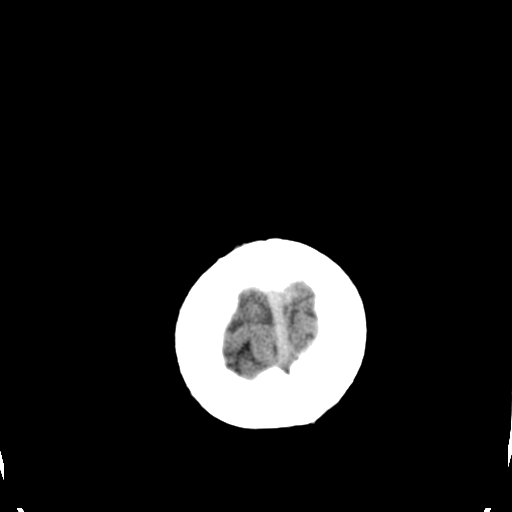
[im 32/36  brain]
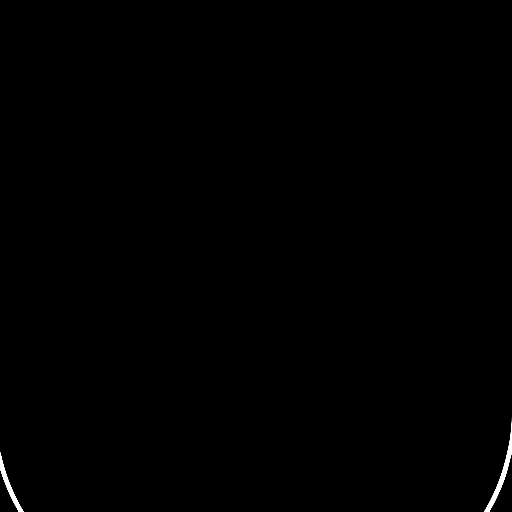
[im 34/36  brain]
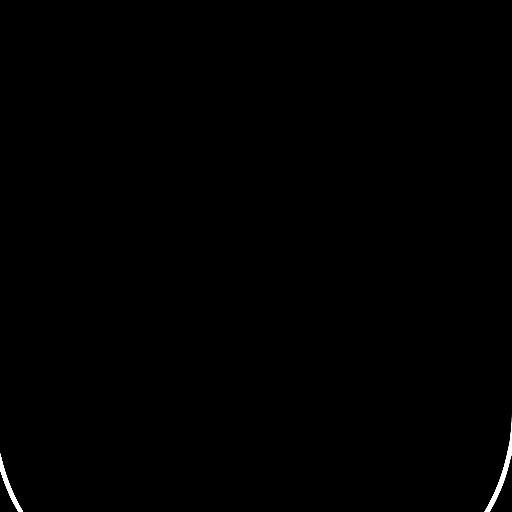

[16 of 30 positions shown; findings below may reference images not displayed]

FINDINGS: No evidence of parenchymal hemorrhage or extra-axial
fluid collection. No mass lesion, mass effect, or midline shift.

No CT evidence of acute infarction.

Cerebral volume is age appropriate.  No ventriculomegaly.

The visualized paranasal sinuses are essentially clear. The mastoid
air cells are unopacified.

No evidence of calvarial fracture.
IMPRESSION: No evidence of acute intracranial abnormality.

## 2017-01-02 DIAGNOSIS — H33011 Retinal detachment with single break, right eye: Secondary | ICD-10-CM | POA: Diagnosis not present

## 2017-01-02 DIAGNOSIS — H35351 Cystoid macular degeneration, right eye: Secondary | ICD-10-CM | POA: Diagnosis not present

## 2017-01-02 DIAGNOSIS — H4040X1 Glaucoma secondary to eye inflammation, unspecified eye, mild stage: Secondary | ICD-10-CM | POA: Diagnosis not present

## 2017-02-14 DIAGNOSIS — E039 Hypothyroidism, unspecified: Secondary | ICD-10-CM | POA: Diagnosis not present

## 2017-02-14 DIAGNOSIS — E782 Mixed hyperlipidemia: Secondary | ICD-10-CM | POA: Diagnosis not present

## 2017-02-14 DIAGNOSIS — E119 Type 2 diabetes mellitus without complications: Secondary | ICD-10-CM | POA: Diagnosis not present

## 2017-02-16 DIAGNOSIS — E782 Mixed hyperlipidemia: Secondary | ICD-10-CM | POA: Diagnosis not present

## 2017-02-16 DIAGNOSIS — E119 Type 2 diabetes mellitus without complications: Secondary | ICD-10-CM | POA: Diagnosis not present

## 2017-02-16 DIAGNOSIS — E039 Hypothyroidism, unspecified: Secondary | ICD-10-CM | POA: Diagnosis not present

## 2017-03-07 DIAGNOSIS — H33053 Total retinal detachment, bilateral: Secondary | ICD-10-CM | POA: Diagnosis not present

## 2017-03-07 DIAGNOSIS — H401122 Primary open-angle glaucoma, left eye, moderate stage: Secondary | ICD-10-CM | POA: Diagnosis not present

## 2017-03-07 DIAGNOSIS — H401113 Primary open-angle glaucoma, right eye, severe stage: Secondary | ICD-10-CM | POA: Diagnosis not present

## 2017-04-13 DIAGNOSIS — H401113 Primary open-angle glaucoma, right eye, severe stage: Secondary | ICD-10-CM | POA: Diagnosis not present

## 2017-04-13 DIAGNOSIS — H401121 Primary open-angle glaucoma, left eye, mild stage: Secondary | ICD-10-CM | POA: Diagnosis not present

## 2017-05-03 DIAGNOSIS — H401113 Primary open-angle glaucoma, right eye, severe stage: Secondary | ICD-10-CM | POA: Diagnosis not present

## 2017-05-19 DIAGNOSIS — E782 Mixed hyperlipidemia: Secondary | ICD-10-CM | POA: Diagnosis not present

## 2017-05-19 DIAGNOSIS — E119 Type 2 diabetes mellitus without complications: Secondary | ICD-10-CM | POA: Diagnosis not present

## 2017-05-19 DIAGNOSIS — E039 Hypothyroidism, unspecified: Secondary | ICD-10-CM | POA: Diagnosis not present

## 2017-05-23 DIAGNOSIS — E782 Mixed hyperlipidemia: Secondary | ICD-10-CM | POA: Diagnosis not present

## 2017-05-23 DIAGNOSIS — E039 Hypothyroidism, unspecified: Secondary | ICD-10-CM | POA: Diagnosis not present

## 2017-05-23 DIAGNOSIS — E119 Type 2 diabetes mellitus without complications: Secondary | ICD-10-CM | POA: Diagnosis not present

## 2017-08-21 DIAGNOSIS — E119 Type 2 diabetes mellitus without complications: Secondary | ICD-10-CM | POA: Diagnosis not present

## 2017-08-21 DIAGNOSIS — E559 Vitamin D deficiency, unspecified: Secondary | ICD-10-CM | POA: Diagnosis not present

## 2017-08-21 DIAGNOSIS — E039 Hypothyroidism, unspecified: Secondary | ICD-10-CM | POA: Diagnosis not present

## 2017-08-24 DIAGNOSIS — E782 Mixed hyperlipidemia: Secondary | ICD-10-CM | POA: Diagnosis not present

## 2017-08-24 DIAGNOSIS — E119 Type 2 diabetes mellitus without complications: Secondary | ICD-10-CM | POA: Diagnosis not present

## 2017-08-24 DIAGNOSIS — E039 Hypothyroidism, unspecified: Secondary | ICD-10-CM | POA: Diagnosis not present

## 2017-11-07 DIAGNOSIS — H401122 Primary open-angle glaucoma, left eye, moderate stage: Secondary | ICD-10-CM | POA: Diagnosis not present

## 2017-11-07 DIAGNOSIS — H401113 Primary open-angle glaucoma, right eye, severe stage: Secondary | ICD-10-CM | POA: Diagnosis not present

## 2017-11-07 DIAGNOSIS — H33053 Total retinal detachment, bilateral: Secondary | ICD-10-CM | POA: Diagnosis not present

## 2017-11-20 DIAGNOSIS — E782 Mixed hyperlipidemia: Secondary | ICD-10-CM | POA: Diagnosis not present

## 2017-11-20 DIAGNOSIS — E039 Hypothyroidism, unspecified: Secondary | ICD-10-CM | POA: Diagnosis not present

## 2017-11-20 DIAGNOSIS — E119 Type 2 diabetes mellitus without complications: Secondary | ICD-10-CM | POA: Diagnosis not present

## 2017-11-24 DIAGNOSIS — E782 Mixed hyperlipidemia: Secondary | ICD-10-CM | POA: Diagnosis not present

## 2017-11-24 DIAGNOSIS — E039 Hypothyroidism, unspecified: Secondary | ICD-10-CM | POA: Diagnosis not present

## 2017-11-24 DIAGNOSIS — E119 Type 2 diabetes mellitus without complications: Secondary | ICD-10-CM | POA: Diagnosis not present

## 2018-02-13 DIAGNOSIS — E119 Type 2 diabetes mellitus without complications: Secondary | ICD-10-CM | POA: Diagnosis not present

## 2018-02-13 DIAGNOSIS — E782 Mixed hyperlipidemia: Secondary | ICD-10-CM | POA: Diagnosis not present

## 2018-02-13 DIAGNOSIS — E039 Hypothyroidism, unspecified: Secondary | ICD-10-CM | POA: Diagnosis not present

## 2018-02-19 DIAGNOSIS — E782 Mixed hyperlipidemia: Secondary | ICD-10-CM | POA: Diagnosis not present

## 2018-02-19 DIAGNOSIS — E1169 Type 2 diabetes mellitus with other specified complication: Secondary | ICD-10-CM | POA: Diagnosis not present

## 2018-02-21 DIAGNOSIS — E782 Mixed hyperlipidemia: Secondary | ICD-10-CM | POA: Diagnosis not present

## 2018-02-21 DIAGNOSIS — Z0000 Encounter for general adult medical examination without abnormal findings: Secondary | ICD-10-CM | POA: Diagnosis not present

## 2018-02-21 DIAGNOSIS — N529 Male erectile dysfunction, unspecified: Secondary | ICD-10-CM | POA: Diagnosis not present

## 2018-03-20 DIAGNOSIS — H401113 Primary open-angle glaucoma, right eye, severe stage: Secondary | ICD-10-CM | POA: Diagnosis not present

## 2018-03-20 DIAGNOSIS — H33053 Total retinal detachment, bilateral: Secondary | ICD-10-CM | POA: Diagnosis not present

## 2018-03-20 DIAGNOSIS — H401122 Primary open-angle glaucoma, left eye, moderate stage: Secondary | ICD-10-CM | POA: Diagnosis not present

## 2018-03-22 DIAGNOSIS — Z961 Presence of intraocular lens: Secondary | ICD-10-CM | POA: Diagnosis not present

## 2018-04-03 DIAGNOSIS — R7989 Other specified abnormal findings of blood chemistry: Secondary | ICD-10-CM | POA: Diagnosis not present

## 2018-05-18 DIAGNOSIS — E782 Mixed hyperlipidemia: Secondary | ICD-10-CM | POA: Diagnosis not present

## 2018-05-18 DIAGNOSIS — E119 Type 2 diabetes mellitus without complications: Secondary | ICD-10-CM | POA: Diagnosis not present

## 2018-05-24 DIAGNOSIS — E119 Type 2 diabetes mellitus without complications: Secondary | ICD-10-CM | POA: Diagnosis not present

## 2018-05-24 DIAGNOSIS — E039 Hypothyroidism, unspecified: Secondary | ICD-10-CM | POA: Diagnosis not present

## 2018-05-24 DIAGNOSIS — E782 Mixed hyperlipidemia: Secondary | ICD-10-CM | POA: Diagnosis not present

## 2018-05-29 DIAGNOSIS — Z5181 Encounter for therapeutic drug level monitoring: Secondary | ICD-10-CM | POA: Diagnosis not present

## 2018-07-24 DIAGNOSIS — H401122 Primary open-angle glaucoma, left eye, moderate stage: Secondary | ICD-10-CM | POA: Diagnosis not present

## 2018-07-24 DIAGNOSIS — H401113 Primary open-angle glaucoma, right eye, severe stage: Secondary | ICD-10-CM | POA: Diagnosis not present

## 2018-07-24 DIAGNOSIS — H33053 Total retinal detachment, bilateral: Secondary | ICD-10-CM | POA: Diagnosis not present

## 2018-08-21 DIAGNOSIS — Z79899 Other long term (current) drug therapy: Secondary | ICD-10-CM | POA: Diagnosis not present

## 2018-08-21 DIAGNOSIS — E119 Type 2 diabetes mellitus without complications: Secondary | ICD-10-CM | POA: Diagnosis not present

## 2018-08-21 DIAGNOSIS — E039 Hypothyroidism, unspecified: Secondary | ICD-10-CM | POA: Diagnosis not present

## 2018-08-23 DIAGNOSIS — Z7984 Long term (current) use of oral hypoglycemic drugs: Secondary | ICD-10-CM | POA: Diagnosis not present

## 2018-08-23 DIAGNOSIS — E782 Mixed hyperlipidemia: Secondary | ICD-10-CM | POA: Diagnosis not present

## 2018-08-23 DIAGNOSIS — E119 Type 2 diabetes mellitus without complications: Secondary | ICD-10-CM | POA: Diagnosis not present

## 2018-08-31 DIAGNOSIS — Z5181 Encounter for therapeutic drug level monitoring: Secondary | ICD-10-CM | POA: Diagnosis not present

## 2018-11-28 DIAGNOSIS — H401113 Primary open-angle glaucoma, right eye, severe stage: Secondary | ICD-10-CM | POA: Diagnosis not present

## 2018-11-28 DIAGNOSIS — H33053 Total retinal detachment, bilateral: Secondary | ICD-10-CM | POA: Diagnosis not present

## 2018-11-28 DIAGNOSIS — H401122 Primary open-angle glaucoma, left eye, moderate stage: Secondary | ICD-10-CM | POA: Diagnosis not present

## 2019-03-08 DIAGNOSIS — E782 Mixed hyperlipidemia: Secondary | ICD-10-CM | POA: Diagnosis not present

## 2019-03-08 DIAGNOSIS — E119 Type 2 diabetes mellitus without complications: Secondary | ICD-10-CM | POA: Diagnosis not present

## 2019-03-19 DIAGNOSIS — E1169 Type 2 diabetes mellitus with other specified complication: Secondary | ICD-10-CM | POA: Diagnosis not present

## 2019-03-19 DIAGNOSIS — E039 Hypothyroidism, unspecified: Secondary | ICD-10-CM | POA: Diagnosis not present

## 2019-05-24 ENCOUNTER — Emergency Department (HOSPITAL_COMMUNITY)
Admission: EM | Admit: 2019-05-24 | Discharge: 2019-05-24 | Disposition: A | Discharge status: Home / Self Care | Payer: No Typology Code available for payment source | Attending: Emergency Medicine | Admitting: Emergency Medicine

## 2019-05-24 ENCOUNTER — Emergency Department (HOSPITAL_COMMUNITY): Payer: No Typology Code available for payment source

## 2019-05-24 ENCOUNTER — Other Ambulatory Visit: Payer: Self-pay

## 2019-05-24 DIAGNOSIS — E039 Hypothyroidism, unspecified: Secondary | ICD-10-CM | POA: Insufficient documentation

## 2019-05-24 DIAGNOSIS — Z87891 Personal history of nicotine dependence: Secondary | ICD-10-CM | POA: Insufficient documentation

## 2019-05-24 DIAGNOSIS — S9782XA Crushing injury of left foot, initial encounter: Secondary | ICD-10-CM

## 2019-05-24 DIAGNOSIS — Y990 Civilian activity done for income or pay: Secondary | ICD-10-CM | POA: Diagnosis not present

## 2019-05-24 DIAGNOSIS — Y939 Activity, unspecified: Secondary | ICD-10-CM | POA: Insufficient documentation

## 2019-05-24 DIAGNOSIS — S92515A Nondisplaced fracture of proximal phalanx of left lesser toe(s), initial encounter for closed fracture: Secondary | ICD-10-CM | POA: Diagnosis not present

## 2019-05-24 DIAGNOSIS — S99922A Unspecified injury of left foot, initial encounter: Secondary | ICD-10-CM | POA: Diagnosis present

## 2019-05-24 DIAGNOSIS — Z79899 Other long term (current) drug therapy: Secondary | ICD-10-CM | POA: Diagnosis not present

## 2019-05-24 DIAGNOSIS — W230XXA Caught, crushed, jammed, or pinched between moving objects, initial encounter: Secondary | ICD-10-CM | POA: Insufficient documentation

## 2019-05-24 DIAGNOSIS — Y929 Unspecified place or not applicable: Secondary | ICD-10-CM | POA: Diagnosis not present

## 2019-05-24 DIAGNOSIS — Z23 Encounter for immunization: Secondary | ICD-10-CM | POA: Diagnosis not present

## 2019-05-24 MED ORDER — TETANUS-DIPHTH-ACELL PERTUSSIS 5-2.5-18.5 LF-MCG/0.5 IM SUSP
0.5000 mL | Freq: Once | INTRAMUSCULAR | Status: AC
  Administered 2019-05-24: 0.5 mL via INTRAMUSCULAR
  Filled 2019-05-24: qty 0.5

## 2019-05-24 MED ORDER — AMOXICILLIN-POT CLAVULANATE 875-125 MG PO TABS: 1.0000 | ORAL_TABLET | Freq: Two times a day (BID) | ORAL | 0 refills | Status: AC

## 2019-05-24 MED ORDER — IBUPROFEN 600 MG PO TABS: 600.0000 mg | ORAL_TABLET | Freq: Four times a day (QID) | ORAL | 0 refills | Status: AC | PRN

## 2019-05-24 MED ORDER — BACITRACIN ZINC 500 UNIT/GM EX OINT
1.0000 "application " | TOPICAL_OINTMENT | Freq: Two times a day (BID) | CUTANEOUS | Status: DC
  Administered 2019-05-24: 1 via TOPICAL

## 2019-05-24 NOTE — ED Notes (Signed)
ED Provider at bedside. 

## 2019-05-24 NOTE — ED Notes (Signed)
Pt returned to room from Xray. 

## 2019-05-24 NOTE — ED Notes (Signed)
Patient verbalizes understanding of discharge instructions. Opportunity for questioning and answers were provided. Armband removed by staff, pt discharged from ED.  

## 2019-05-24 NOTE — ED Provider Notes (Signed)
[facility] Provider Note   CSN: [id number] Arrival date & time: 05/24/19  1259    History   Chief Complaint Chief Complaint  Patient presents with  . Foot Injury    HPI Dennis Valencia is a 58 y.o. male.     HPI  58 year old male, presents to the hospital today complaining of some left-sided foot injury that occurred just prior to arrival when a forklift knocked him to the ground and slightly ran over his left distal foot.  He states that he had acute onset of mild pain, he has been able to ambulate since and states that his pain is minimal.  He did suffer a laceration on the medial aspect of the distal foot associated with a crush injury but there is been minimal bleeding, he is not aware of his last tetanus status and has no other injuries.  Past Medical History:  Diagnosis Date  . Anal condyloma   . Condyloma acuminatum of penis   . Hypothyroidism     Patient Active Problem List   Diagnosis Date Noted  . Condyloma acuminatum of penis 10/11/2012  . DYSLIPIDEMIA 12/31/2010  . FATIGUE 12/31/2010  . WEIGHT GAIN 12/31/2010  . DECREASED LIBIDO 12/31/2010  . HYPOTHYROIDISM 12/02/2010  . NONSPECIFIC ABNORMAL RESULTS LIVR FUNCTION STUDY 12/02/2010    Past Surgical History:  Procedure Laterality Date  . CONDYLOMA EXCISION/FULGURATION  10/12/2012   Procedure: CONDYLOMA REMOVAL;  Surgeon: Garnett FarmMark C Ottelin, MD;  [facility];  Service: Urology;  Laterality: N/A;  CO2 LASER OF PENILE AD ANAL WARTS        Home Medications    Prior to Admission medications   Medication Sig Start Date End Date Taking? Authorizing Provider  amoxicillin-clavulanate (AUGMENTIN) 875-125 MG tablet Take 1 tablet by mouth every 12 (twelve) hours. 05/24/19   Eber HongMiller, Tymira Horkey, MD  ibuprofen (ADVIL) 600 MG tablet Take 1 tablet (600 mg total) by mouth every 6 (six) hours as needed. 05/24/19   Eber HongMiller, Ahmari Garton, MD  Levothyroxine Sodium (TIROSINT) 125  MCG CAPS Take 125 mcg by mouth every morning.    [provider]  Multiple Vitamin (MULTIVITAMIN WITH MINERALS) TABS Take 1 tablet by mouth daily.    [provider]    Family History No family history on file.  Social History Social History   Tobacco Use  . Smoking status: Former Smoker    Packs/day: 0.50    Years: 8.00    Pack years: 4.00    Types: Cigarettes    Last attempt to quit: 10/09/2010    Years since quitting: 8.6  . Smokeless tobacco: Never Used  Substance Use Topics  . Alcohol use: No  . Drug use: No     Allergies   Patient has no known allergies.   Review of Systems Review of Systems  All other systems reviewed and are negative.    Physical Exam Updated Vital Signs BP (!) 119/92 (BP Location: Right Arm)   Pulse 85   Temp (!) 97.4 F (36.3 C) (Oral)   Resp 17   Ht 1.981 m (6\' 6" )   Wt 107 kg   SpO2 97%   BMI 27.27 kg/m   Physical Exam Vitals signs and nursing note reviewed.  Constitutional:      General: He is not in acute distress.    Appearance: He is well-developed.  HENT:     Head: Normocephalic and atraumatic.     Mouth/Throat:  Pharynx: No oropharyngeal exudate.  Eyes:     General: No scleral icterus.       Right eye: No discharge.        Left eye: No discharge.     Conjunctiva/sclera: Conjunctivae normal.     Pupils: Pupils are equal, round, and reactive to light.  Neck:     Musculoskeletal: Normal range of motion and neck supple.     Thyroid: No thyromegaly.     Vascular: No JVD.  Cardiovascular:     Rate and Rhythm: Normal rate and regular rhythm.     Heart sounds: Normal heart sounds. No murmur. No friction rub. No gallop.   Pulmonary:     Effort: Pulmonary effort is normal. No respiratory distress.     Breath sounds: Normal breath sounds. No wheezing or rales.  Abdominal:     General: Bowel sounds are normal. There is no distension.     Palpations: Abdomen is soft. There is no mass.      Tenderness: There is no abdominal tenderness.  Musculoskeletal: Normal range of motion.        General: Swelling, tenderness, deformity and signs of injury present.     Comments: Normal ROM of the L ankle and knee - ttp mild over the distal foot - swelling and bruising present on the plantar aspect predominantly o fthe L foot - there are 2 parallel skin tear / lacerations to the medial foot - there is no active bleeding and normal ROM of all toes with minimal pain.  Lymphadenopathy:     Cervical: No cervical adenopathy.  Skin:    General: Skin is warm and dry.     Findings: No erythema or rash.  Neurological:     Mental Status: He is alert.     Coordination: Coordination normal.  Psychiatric:        Behavior: Behavior normal.            ED Treatments / Results  Labs (all labs ordered are listed, but only abnormal results are displayed) Labs Reviewed - No data to display  EKG None  Radiology Dg Foot Complete Left  Result Date: 05/24/2019 CLINICAL DATA:  Crush injury to foot EXAM: LEFT FOOT - COMPLETE 3+ VIEW COMPARISON:  None. FINDINGS: Fracture noted through the proximal phalanx of the left 5th toe with minimal displacement. No subluxation or dislocation. 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 Soft tissues are intact with mild soft tissue swelling laterally. IMPRESSION: Fracture through the proximal phalanx of the left 5th toe. Electronically Signed   By: Charlett Nose M.D.   On: 05/24/2019 13:38    Procedures Procedures (including critical care time)  Medications Ordered in ED Medications  bacitracin ointment 1 application (has no administration in time range)  Tdap (BOOSTRIX) injection 0.5 mL (0.5 mLs Intramuscular Given 05/24/19 1405)     Initial Impression / Assessment and Plan / ED Course  I have reviewed the triage vital signs and the nursing notes.  Pertinent labs & imaging results that were available during my care of the patient were reviewed by me and  considered in my medical decision making (see chart for details).  Clinical Course as of [place] [address] 1506  Fri Friedt 29, 2020  1449 Given minimal pain I doubt that there is a compartment syndrome however he does have a fractured fifth proximal phalanx.   [BM]  1450 Patient will be treated with some anti-inflammatories, ice, elevating the foot, will leave the wounds open as  it is more skin tears and lacerations.  Bleeding controlled   [BM]    Clinical Course User Index [BM] Eber Hong, MD       The patient has focal injuries to his foot, he will need to have this cleaned and imaged, update tetanus, patient otherwise appears stable  Wound was cleaned, irrigated, the patient has tolerated this very well and is aware of his pinky toe fracture.  He states that he kicked an ottoman last week and that is why that part of the foot is broken.  There is no other broken bones, he is aware of the signs of compartment syndrome and agreeable to return should that happen.  Will place on Augmentin, ibuprofen, stable for discharge  Final Clinical Impressions(s) / ED Diagnoses   Final diagnoses:  Crushing injury of left foot, initial encounter  Closed nondisplaced fracture of proximal phalanx of lesser toe of left foot, initial encounter    ED Discharge Orders         Ordered    amoxicillin-clavulanate (AUGMENTIN) 875-125 MG tablet  Every 12 hours     05/24/19 1505    ibuprofen (ADVIL) 600 MG tablet  Every 6 hours PRN     05/24/19 1505           Eber Hong, MD 05/24/19 1506

## 2019-05-24 NOTE — ED Notes (Signed)
Pts manger from [employer] : [phone]

## 2019-05-24 NOTE — ED Triage Notes (Addendum)
Pt arrives with [facility] from work; pt works for [employer] when someone ran over left foot with forklift at around 1200 today. Pt states he feels some "discomfort" and pain is 1/10. Pt's foot wrapped by EMS and reports controlled bleeding.

## 2019-05-24 NOTE — ED Notes (Signed)
Pt' left foot soaked in 2L of normal saline and betadine per Dr. Hyacinth Meeker.

## 2019-05-24 NOTE — Discharge Instructions (Signed)
Your xray shows a small fracture of your small toe - but no other fractures or you foot.  Augmentin twice daily for 7 days Ibuprofen 600mg  3 times daily Keep the foot elevated, ice and rest Use the post op shoe when walking  Orthopedic in the next week for recheck  ER for severe pain or color change to the skin

## 2019-05-24 NOTE — ED Notes (Signed)
Wounds on lateral side of left foot irrigated with normal saline, cleansed with wound cleanser, applied bacitracin, non-adherant pad and wrapped with kerlix. Ortho tech called to apply boot; en route to pt's room.

## 2019-05-24 NOTE — Progress Notes (Signed)
Orthopedic Tech Progress Note Patient Details:  Dennis Valencia Jul 14, 1961 [id number]  Ortho Devices Type of Ortho Device: Postop shoe/boot Ortho Device/Splint Interventions: Application   Post Interventions Patient Tolerated: Well   [name] 05/24/2019, 3:09 PM

## 2019-05-24 NOTE — ED Notes (Signed)
Patient transported to X-ray 

## 2019-11-11 ENCOUNTER — Emergency Department (HOSPITAL_COMMUNITY)
Admission: EM | Admit: 2019-11-11 | Discharge: 2019-11-11 | Disposition: A | Discharge status: Home / Self Care | Payer: 59 | Attending: Emergency Medicine | Admitting: Emergency Medicine

## 2019-11-11 ENCOUNTER — Other Ambulatory Visit: Payer: Self-pay

## 2019-11-11 ENCOUNTER — Encounter (HOSPITAL_COMMUNITY): Payer: Self-pay

## 2019-11-11 DIAGNOSIS — Z5321 Procedure and treatment not carried out due to patient leaving prior to being seen by health care provider: Secondary | ICD-10-CM | POA: Diagnosis not present

## 2019-11-11 DIAGNOSIS — N489 Disorder of penis, unspecified: Secondary | ICD-10-CM | POA: Diagnosis present

## 2019-11-11 MED ORDER — ACETAMINOPHEN 325 MG PO TABS
650.0000 mg | ORAL_TABLET | Freq: Once | ORAL | Status: AC | PRN
  Administered 2019-11-11: 650 mg via ORAL
  Filled 2019-11-11: qty 2

## 2019-11-11 NOTE — ED Notes (Signed)
Patient notified registration that he was leaving.  

## 2019-11-11 NOTE — ED Triage Notes (Signed)
Patient states he had "a small bump" on his penis yesterday and today the whole foreskin is red and swollen. Patient went to his PCP's office and was told to come to the ED. Patient denies any problems urinating.

## 2021-04-03 IMAGING — CR LEFT FOOT - COMPLETE 3+ VIEW
3 series · 3 of 3 positions shown · non-contrast
Comparison: None.

CLINICAL DATA: Crush injury to foot

EXAM:
LEFT FOOT - COMPLETE 3+ VIEW

[foot ap]
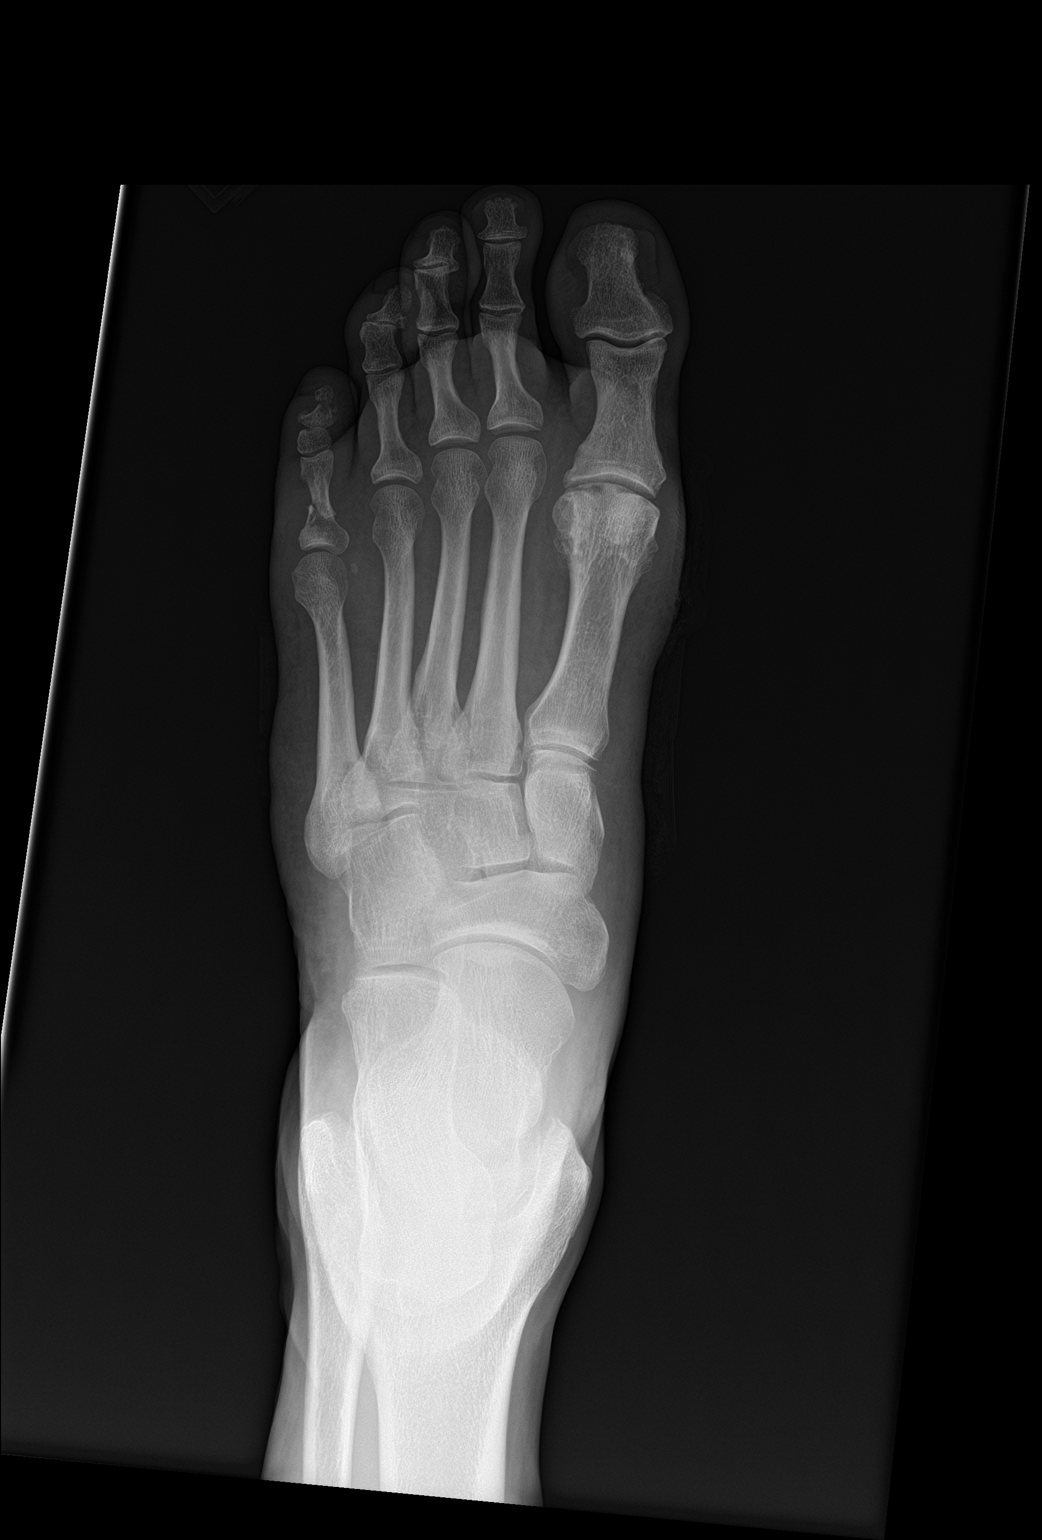

[foot obl]
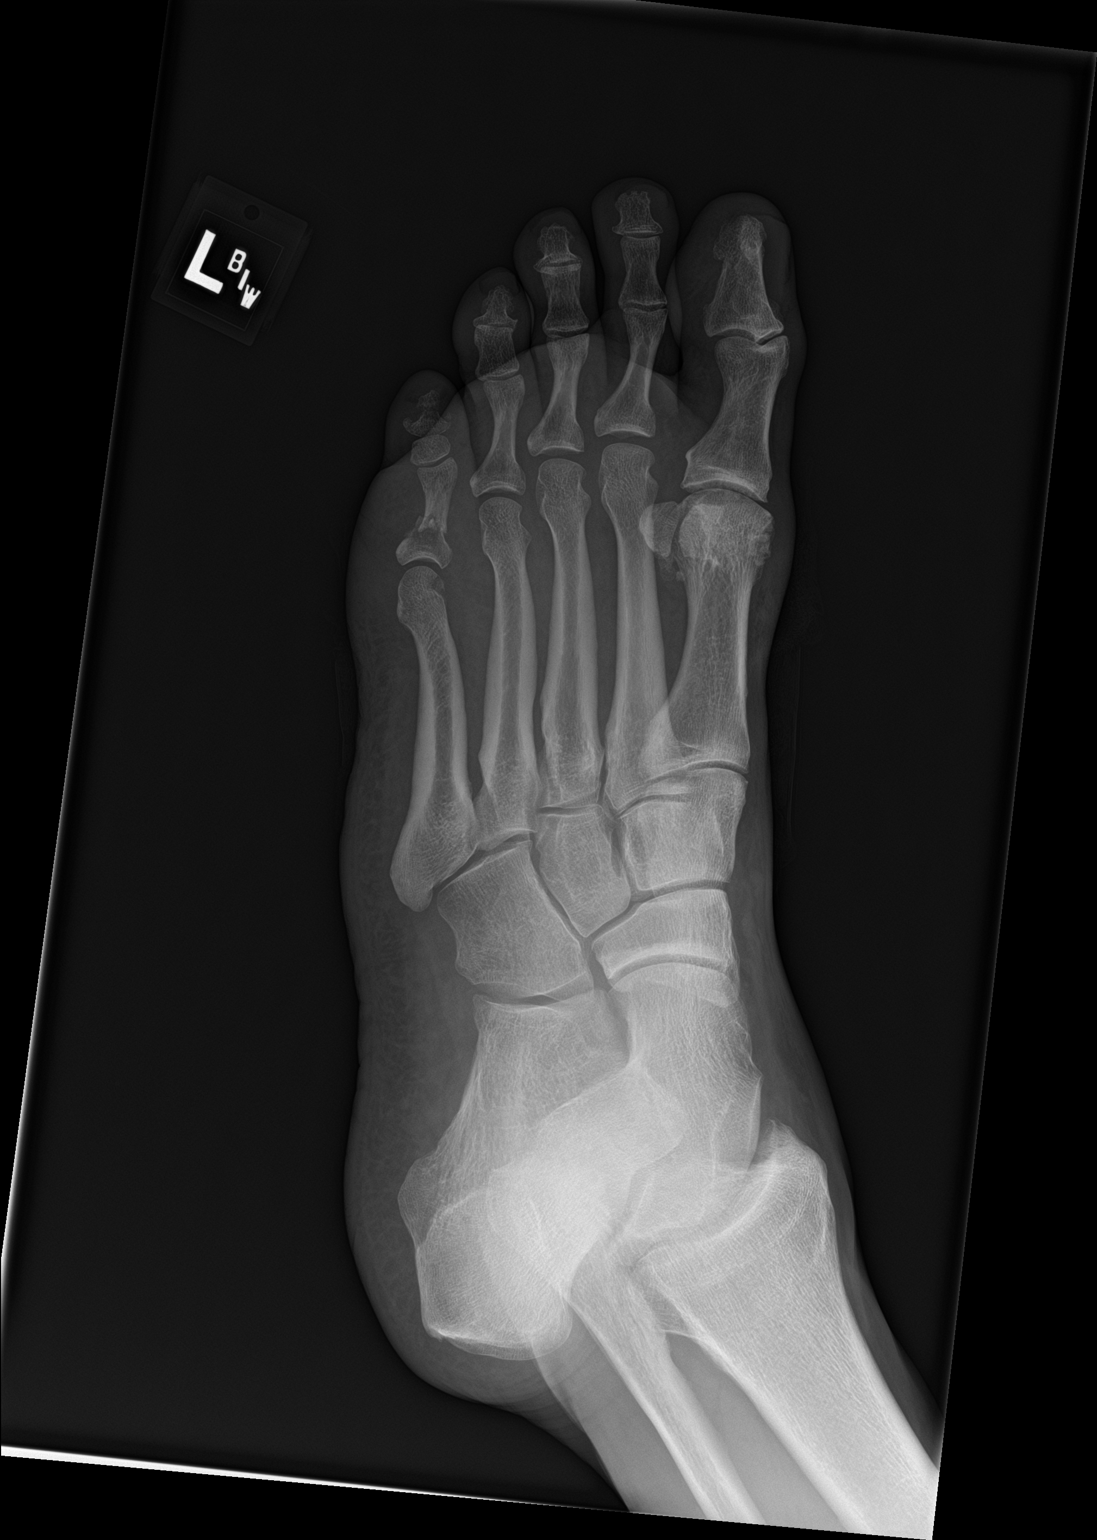

[foot lat]
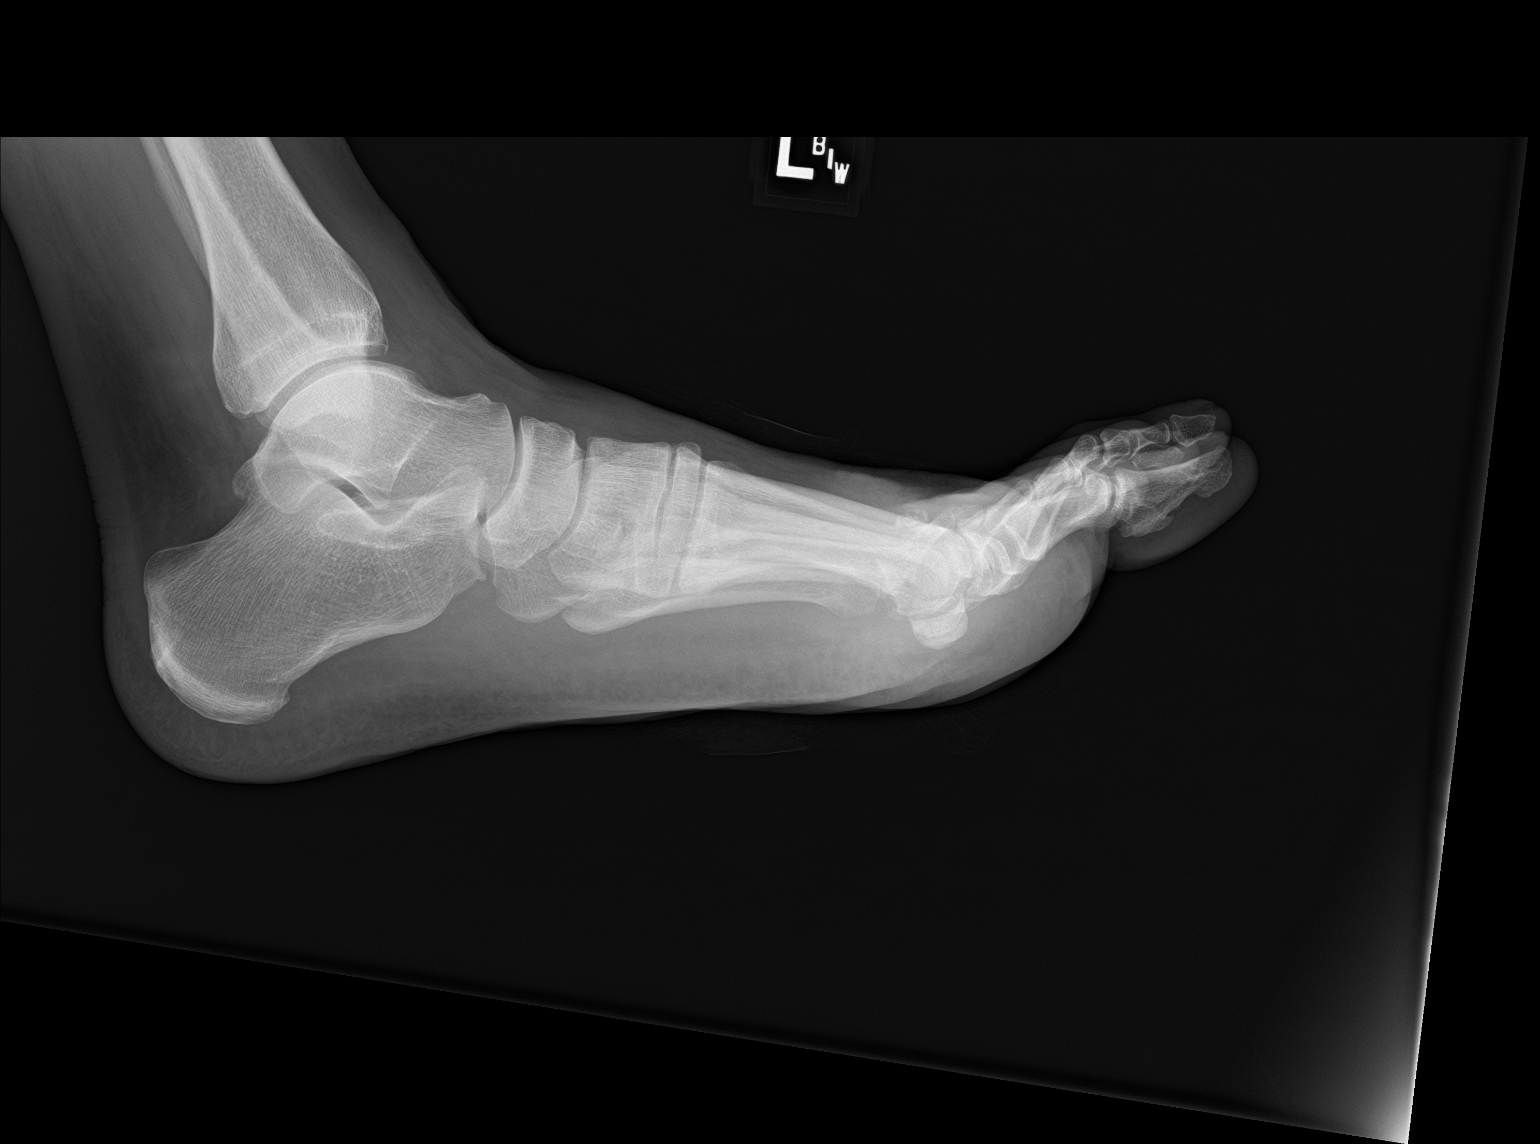

[3 of 3 positions shown; findings below may reference images not displayed]

FINDINGS: Fracture noted through the proximal phalanx of the left 5th toe with
minimal displacement. No subluxation or dislocation. Mild
osteoarthritis in the 1st MTP joint. Soft tissues are intact with
mild soft tissue swelling laterally.
IMPRESSION: Fracture through the proximal phalanx of the left 5th toe.
